# Patient Record
Sex: Male | Born: 1960 | Race: White | Hispanic: No | Marital: Single | Smoking: Current every day smoker
Health system: Southern US, Community
[De-identification: ages and names within clinical notes are randomized; demographics above are authoritative.]

## PROBLEM LIST (undated history)

## (undated) DIAGNOSIS — F4310 Post-traumatic stress disorder, unspecified: Secondary | ICD-10-CM

## (undated) DIAGNOSIS — B1920 Unspecified viral hepatitis C without hepatic coma: Secondary | ICD-10-CM

## (undated) DIAGNOSIS — G8929 Other chronic pain: Secondary | ICD-10-CM

## (undated) DIAGNOSIS — M549 Dorsalgia, unspecified: Secondary | ICD-10-CM

## (undated) HISTORY — PX: TONSILLECTOMY: SUR1361

---

## 1998-03-30 ENCOUNTER — Emergency Department (HOSPITAL_COMMUNITY): Admission: EM | Admit: 1998-03-30 | Discharge: 1998-03-30 | Payer: Self-pay | Admitting: Emergency Medicine

## 2010-08-17 ENCOUNTER — Emergency Department (HOSPITAL_COMMUNITY)
Admission: EM | Admit: 2010-08-17 | Discharge: 2010-08-17 | Payer: Self-pay | Source: Home / Self Care | Admitting: Emergency Medicine

## 2010-08-20 ENCOUNTER — Emergency Department (HOSPITAL_COMMUNITY)
Admission: EM | Admit: 2010-08-20 | Discharge: 2010-08-20 | Payer: Self-pay | Source: Home / Self Care | Admitting: Emergency Medicine

## 2010-10-24 ENCOUNTER — Emergency Department (HOSPITAL_COMMUNITY)
Admission: EM | Admit: 2010-10-24 | Discharge: 2010-10-24 | Disposition: A | Discharge status: Home / Self Care | Payer: Self-pay | Attending: Emergency Medicine | Admitting: Emergency Medicine

## 2010-10-24 DIAGNOSIS — K029 Dental caries, unspecified: Secondary | ICD-10-CM | POA: Insufficient documentation

## 2010-10-24 DIAGNOSIS — K089 Disorder of teeth and supporting structures, unspecified: Secondary | ICD-10-CM | POA: Insufficient documentation

## 2017-01-09 ENCOUNTER — Other Ambulatory Visit: Payer: Self-pay | Admitting: Gastroenterology

## 2017-01-09 DIAGNOSIS — R109 Unspecified abdominal pain: Secondary | ICD-10-CM

## 2017-01-10 ENCOUNTER — Ambulatory Visit
Admission: RE | Admit: 2017-01-10 | Discharge: 2017-01-10 | Disposition: A | Discharge status: Home / Self Care | Payer: Medicare Other | Source: Ambulatory Visit | Attending: Gastroenterology | Admitting: Gastroenterology

## 2017-01-10 DIAGNOSIS — R109 Unspecified abdominal pain: Secondary | ICD-10-CM

## 2017-01-10 IMAGING — CT CT ABD-PELV W/ CM
3 of 5 series · 12 of 36 positions shown, 18 images · IV contrast (READICAT/WATER & [ID] ISOVUE 300)
Comparison: None.

CLINICAL DATA: General abd pain x 3 months. Nausea and vomiting.
Occ fever, diarrhea and constipation. No prev surg or hx ca. No prev
CT.^100mL [id number]-[id number] IOPAMIDOL (A9R9XF-LZZ) INJECTION 61%

EXAM:
CT ABDOMEN AND PELVIS WITH CONTRAST
TECHNIQUE: Multidetector CT imaging of the abdomen and pelvis was performed
using the standard protocol following bolus administration of
intravenous contrast.
CONTRAST:  100mL [id number]-[id number] IOPAMIDOL (A9R9XF-LZZ) INJECTION 61%

[Series 3: abd/pelvis with · axial · 0.74mm/px · z∈[-336,+14]mm · 8 of 92 slices shown, 13 images]
[im 11/92  soft-tissue]
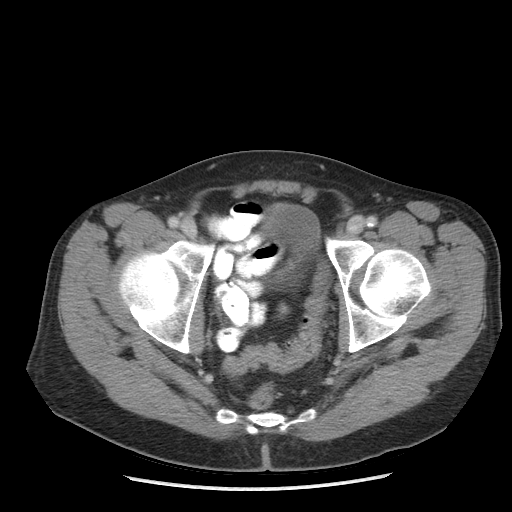
[im 11/92  bone]
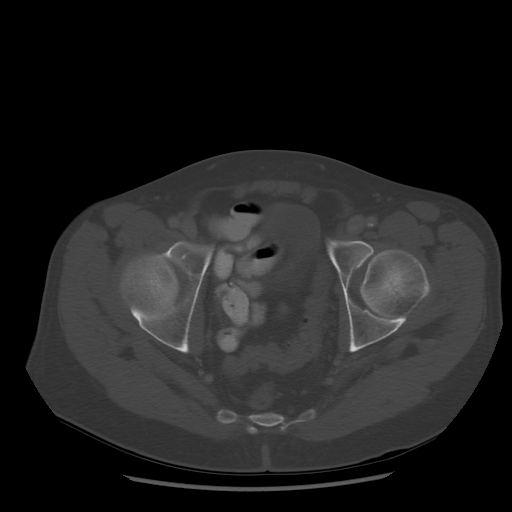
[im 21/92  soft-tissue]
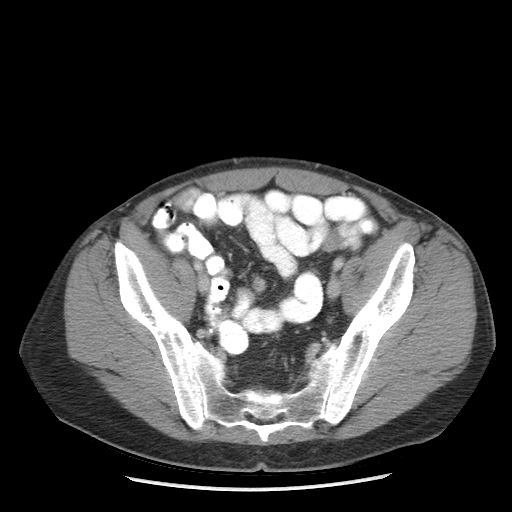
[im 31/92  soft-tissue]
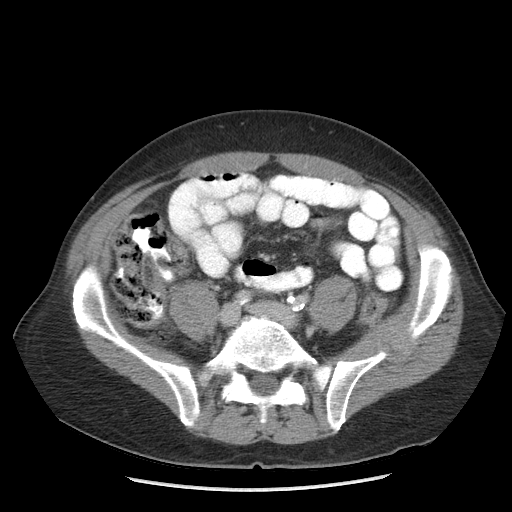
[im 41/92  soft-tissue]
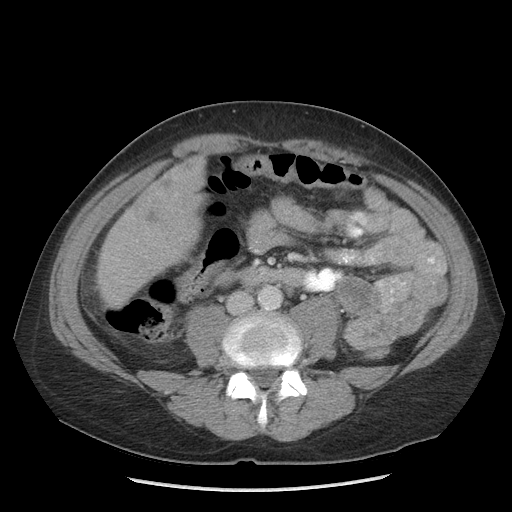
[im 51/92  soft-tissue]
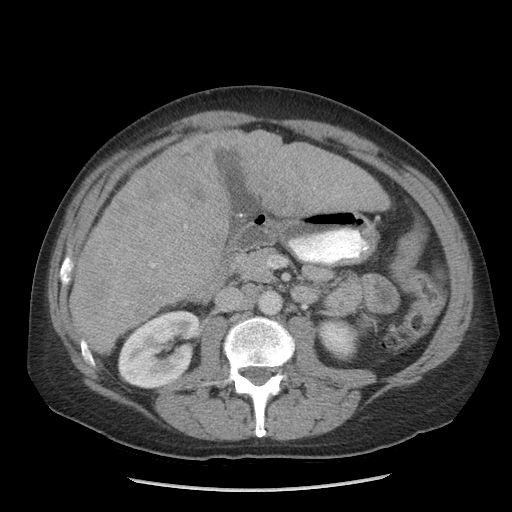
[im 51/92  lung]
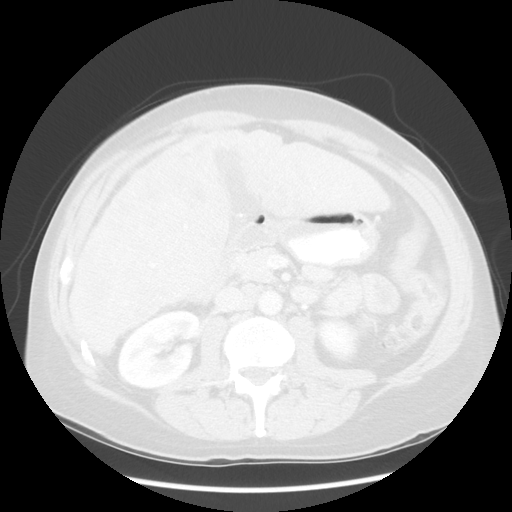
[im 61/92  soft-tissue]
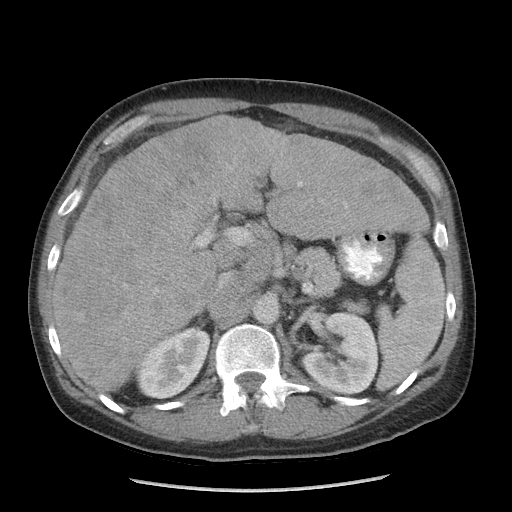
[im 61/92  lung]
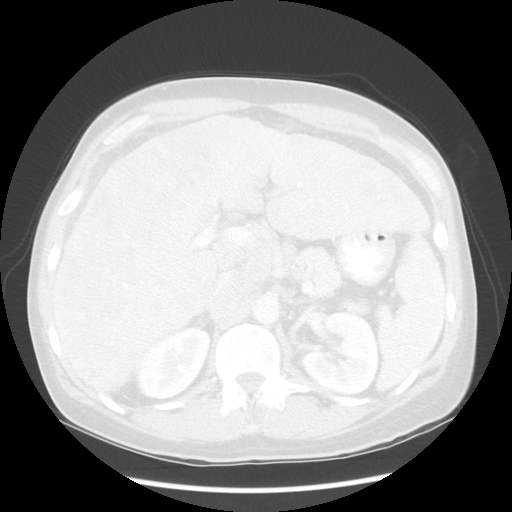
[im 71/92  soft-tissue]
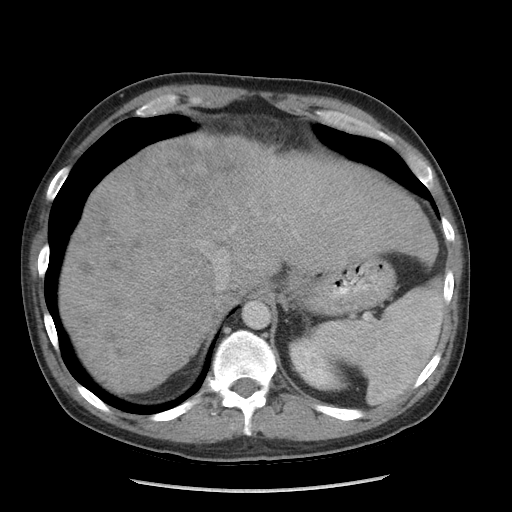
[im 71/92  lung]
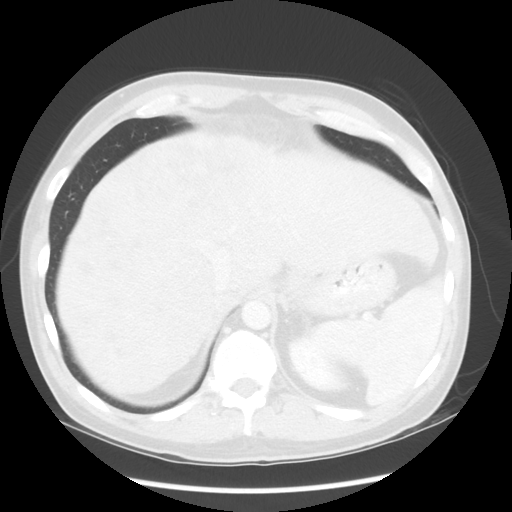
[im 81/92  soft-tissue]
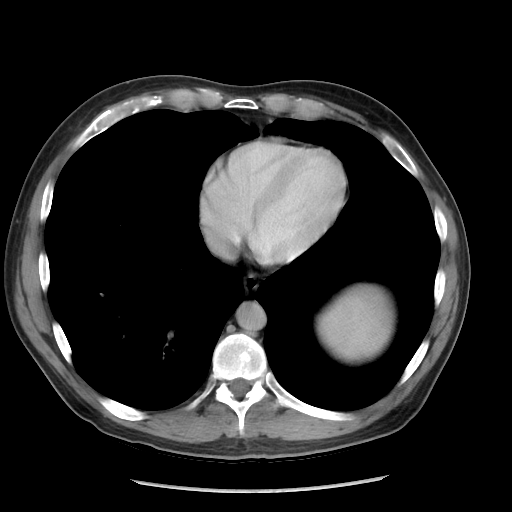
[im 81/92  lung]
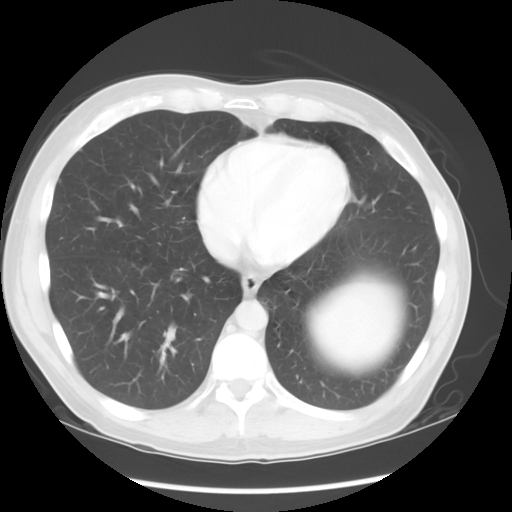

[Series 601: coronal body · coronal · 0.99mm/px · 1 of 121 slices shown, 2 images]
[im 41/121  soft-tissue]
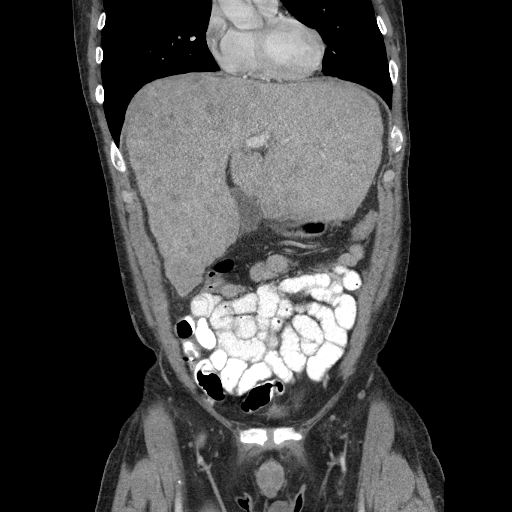
[im 41/121  bone]
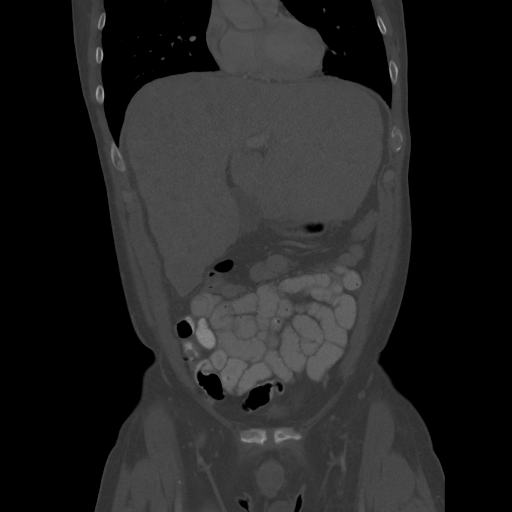

[Series 602: sagittal body · sagittal · 0.99mm/px · 3 of 153 slices shown]
[im 11/153  soft-tissue]
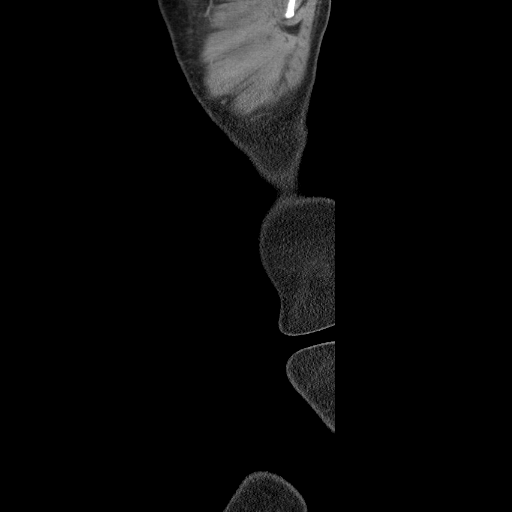
[im 31/153  soft-tissue]
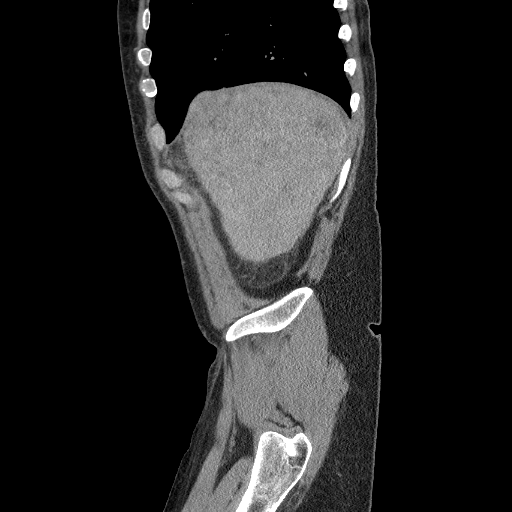
[im 51/153  soft-tissue]
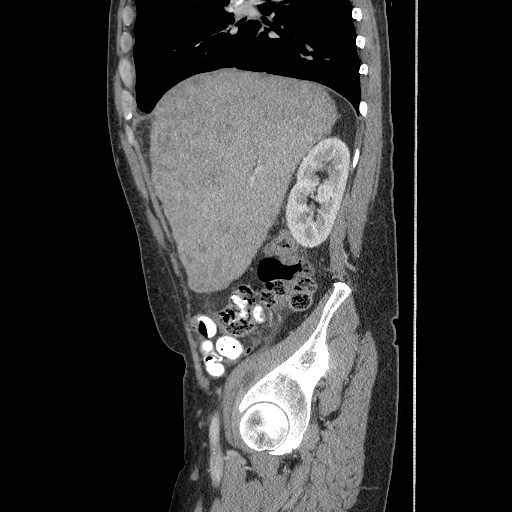

[12 of 36 positions shown; findings below may reference images not displayed]

FINDINGS: Lower chest: Centrilobular emphysema. Right middle lobe 2 mm nodule
with on image 11/series 4. Normal heart size without pericardial or
pleural effusion.

Hepatobiliary: Hepatomegaly at 20.9 cm. Diffuse heterogeneous
hepatic density, most consistent with diffuse metastasis or
multifocal hepatocellular carcinoma. Example high right hepatic lobe
hypoattenuating lesion at 1.9 cm on image 25/series 3. Lateral
segment left liver lobe hypoattenuating lesion at 2.9 cm on image
35/series 3. Probable small gallston, without acute cholecystitis or
biliary duct dilatation.

Pancreas: Normal, without mass or ductal dilatation.

Spleen: Mild splenomegaly 14.9 cm craniocaudal.

Adrenals/Urinary Tract: Normal adrenal glands. Normal kidneys,
without hydronephrosis. Normal urinary bladder.

Stomach/Bowel: Normal stomach, without wall thickening. Scattered
colonic diverticula. Normal terminal ileum and appendix. Normal
small bowel.

Vascular/Lymphatic: Aortic and branch vessel atherosclerosis.
Abdominal adenopathy, including an aortocaval node which measures 11
mm on image 41/series 3. Porta hepatis node measures 2.3 x 4.3 cm on
image 36/series 3. No pelvic sidewall adenopathy.

Reproductive: Normal prostate.

Other: No significant free fluid. No evidence of omental or
peritoneal disease.

Musculoskeletal: Osseous metastasis, including a lytic lesion within
the posterior right acetabulum which measures 3.1 x 2.9 cm on image
87/series 3. No extension to the joint surface.

left iliac lytic lesion on image 63/series 3. Degenerative disc
disease at the lumbosacral junction.
IMPRESSION: 1. Hepatomegaly with diffuse hepatic metastasis versus multifocal
hepatocellular carcinoma. Irregular hepatic capsule is suspicious
for cirrhosis. Alternatively, capsular irregularity could be
secondary to diffuse metastasis.
2. Bone metastasis.
3. Abdominal adenopathy, most consistent with nodal metastasis.
4. Consider PET to direct tissue sampling versus liver biopsy.
5.  Aortic atherosclerosis.
6. Nonspecific right lung base nodule.
These results will be called to the ordering clinician or
representative by the Radiologist Assistant, and communication
documented in the PACS or zVision Dashboard.

## 2017-01-10 MED ORDER — IOPAMIDOL (ISOVUE-300) INJECTION 61%
100.0000 mL | Freq: Once | INTRAVENOUS | Status: AC | PRN
  Administered 2017-01-10: 100 mL via INTRAVENOUS

## 2017-01-11 ENCOUNTER — Emergency Department (HOSPITAL_COMMUNITY)
Admission: EM | Admit: 2017-01-11 | Discharge: 2017-01-11 | Disposition: A | Discharge status: Home / Self Care | Payer: Medicare Other | Attending: Emergency Medicine | Admitting: Emergency Medicine

## 2017-01-11 ENCOUNTER — Encounter (HOSPITAL_COMMUNITY): Payer: Self-pay | Admitting: Emergency Medicine

## 2017-01-11 DIAGNOSIS — R109 Unspecified abdominal pain: Secondary | ICD-10-CM | POA: Insufficient documentation

## 2017-01-11 DIAGNOSIS — Z5321 Procedure and treatment not carried out due to patient leaving prior to being seen by health care provider: Secondary | ICD-10-CM | POA: Insufficient documentation

## 2017-01-11 HISTORY — DX: Other chronic pain: G89.29

## 2017-01-11 HISTORY — DX: Post-traumatic stress disorder, unspecified: F43.10

## 2017-01-11 HISTORY — DX: Unspecified viral hepatitis C without hepatic coma: B19.20

## 2017-01-11 HISTORY — DX: Dorsalgia, unspecified: M54.9

## 2017-01-11 NOTE — ED Notes (Signed)
Pt states he was not waiting to see the doctor as his doctor told him he was going to have a bed in the hospital  Pt was informed he had a bed in the ED and the ED Dr would see him and if he needed to be admitted we would do that  Pt states he is not waiting to see the doctor and he would call his own dr when he got home

## 2017-01-11 NOTE — ED Triage Notes (Signed)
Pt states he was sent here by Dr Paulita Fujita for a liver biopsy to test for liver cancer  Pt states he was told he would have a room but registration states they dont know anything about it  Pt states he has sharp stabbing abd pain

## 2017-01-12 ENCOUNTER — Emergency Department (HOSPITAL_COMMUNITY)
Admission: EM | Admit: 2017-01-12 | Discharge: 2017-01-12 | Disposition: A | Discharge status: Home / Self Care | Payer: Medicare Other | Attending: Emergency Medicine | Admitting: Emergency Medicine

## 2017-01-12 ENCOUNTER — Encounter (HOSPITAL_COMMUNITY): Payer: Self-pay | Admitting: Emergency Medicine

## 2017-01-12 DIAGNOSIS — R109 Unspecified abdominal pain: Secondary | ICD-10-CM

## 2017-01-12 DIAGNOSIS — F17210 Nicotine dependence, cigarettes, uncomplicated: Secondary | ICD-10-CM | POA: Diagnosis not present

## 2017-01-12 DIAGNOSIS — R1011 Right upper quadrant pain: Secondary | ICD-10-CM | POA: Diagnosis present

## 2017-01-12 LAB — CBC
HEMATOCRIT: 40.5 % (ref 39.0–52.0)
HEMOGLOBIN: 13.4 g/dL (ref 13.0–17.0)
MCH: 31.2 pg (ref 26.0–34.0)
MCHC: 33.1 g/dL (ref 30.0–36.0)
MCV: 94.4 fL (ref 78.0–100.0)
Platelets: 235 10*3/uL (ref 150–400)
RBC: 4.29 MIL/uL (ref 4.22–5.81)
RDW: 16 % — ABNORMAL HIGH (ref 11.5–15.5)
WBC: 6.8 10*3/uL (ref 4.0–10.5)

## 2017-01-12 LAB — COMPREHENSIVE METABOLIC PANEL
ALBUMIN: 3.2 g/dL — AB (ref 3.5–5.0)
ALT: 88 U/L — ABNORMAL HIGH (ref 17–63)
ANION GAP: 7 (ref 5–15)
AST: 118 U/L — AB (ref 15–41)
Alkaline Phosphatase: 198 U/L — ABNORMAL HIGH (ref 38–126)
BUN: 8 mg/dL (ref 6–20)
CHLORIDE: 101 mmol/L (ref 101–111)
CO2: 24 mmol/L (ref 22–32)
Calcium: 11.5 mg/dL — ABNORMAL HIGH (ref 8.9–10.3)
Creatinine, Ser: 0.85 mg/dL (ref 0.61–1.24)
GFR calc Af Amer: >60 mL/min (ref >60)
GFR calc non Af Amer: >60 mL/min (ref >60)
GLUCOSE: 111 mg/dL — AB (ref 65–99)
POTASSIUM: 4.1 mmol/L (ref 3.5–5.1)
SODIUM: 132 mmol/L — AB (ref 135–145)
Total Bilirubin: 0.6 mg/dL (ref 0.3–1.2)
Total Protein: 7.9 g/dL (ref 6.5–8.1)

## 2017-01-12 LAB — URINALYSIS, ROUTINE W REFLEX MICROSCOPIC
Bilirubin Urine: NEGATIVE
GLUCOSE, UA: NEGATIVE mg/dL
HGB URINE DIPSTICK: NEGATIVE
Ketones, ur: NEGATIVE mg/dL
Leukocytes, UA: NEGATIVE
NITRITE: NEGATIVE
PH: 5 (ref 5.0–8.0)
Protein, ur: NEGATIVE mg/dL
SPECIFIC GRAVITY, URINE: 1.023 (ref 1.005–1.030)

## 2017-01-12 LAB — LIPASE, BLOOD: Lipase: 23 U/L (ref 11–51)

## 2017-01-12 MED ORDER — SODIUM CHLORIDE 0.9 % IV SOLN: INTRAVENOUS | Status: DC

## 2017-01-12 MED ORDER — ONDANSETRON HCL 4 MG/2ML IJ SOLN
4.0000 mg | Freq: Once | INTRAMUSCULAR | Status: DC
  Filled 2017-01-12: qty 2

## 2017-01-12 MED ORDER — SODIUM CHLORIDE 0.9 % IV BOLUS (SEPSIS)
1000.0000 mL | Freq: Once | INTRAVENOUS | Status: AC
  Administered 2017-01-12: 1000 mL via INTRAVENOUS

## 2017-01-12 MED ORDER — KETOROLAC TROMETHAMINE 30 MG/ML IJ SOLN
15.0000 mg | Freq: Once | INTRAMUSCULAR | Status: DC
  Filled 2017-01-12: qty 1

## 2017-01-12 MED ORDER — HYDROMORPHONE HCL 1 MG/ML IJ SOLN
1.0000 mg | Freq: Once | INTRAMUSCULAR | Status: DC
  Filled 2017-01-12: qty 1

## 2017-01-12 NOTE — ED Triage Notes (Signed)
Patient reports that he was sent here by Dr Paulita Fujita for complaints of liver pain. Reports abdominal pain on right side. Nausea/vomiting. Patient in triage drinking sweet tea.

## 2017-01-12 NOTE — ED Notes (Signed)
Pt called for v/s recheck, no response from lobby 

## 2017-01-12 NOTE — ED Notes (Signed)
Pt called for v/s recheck, pt not in lobby

## 2017-01-12 NOTE — ED Notes (Signed)
Pt walking out to car to get reading glasses at this time. Will get v/s when pt returns

## 2017-01-12 NOTE — ED Notes (Signed)
Patient refuse blood draw

## 2017-01-12 NOTE — ED Notes (Signed)
Patient requested to not be straight stuck for blood work, requests for the IV so he does not have to be stuck multiple times. RN aware.

## 2017-01-12 NOTE — ED Notes (Signed)
Patient removed iv himself, states it was sucking air.  RN explained to patient that it was saline locked and it wasn't possible for it to suck in air. Pt refused toradol, stated it gave him a bad headache. Pt also refused additional fluids and stated he was just ready to get out of here.

## 2017-01-12 NOTE — ED Provider Notes (Signed)
[facility] DEPT Provider Note   CSN: [id number] Arrival date & time: 01/12/17  1534     History   Chief Complaint Chief Complaint  Patient presents with  . Abdominal Pain    HPI Dennis Bell is a 56 y.o. male.  56 year old male with history of hepatitis C and cirrhosis presents with ongoing chronic right upper quadrant abdominal discomfort due to his hepatitis. Denies any recent fever, chills, vomiting. No black or bloody stools. Saw his gastroenterologist and has been instructed that he needs to have a liver biopsies. States his pain is been persistent and characterized as sharp. Worse with certain movements and a better with nothing. No medication use prior to arrival. Denies any current use of alcohol or illicit drugs      Past Medical History:  Diagnosis Date  . Chronic back pain   . Hepatitis C   . PTSD (post-traumatic stress disorder)     There are no active problems to display for this patient.   Past Surgical History:  Procedure Laterality Date  . TONSILLECTOMY         Home Medications    Prior to Admission medications   Not on File    Family History Family History  Problem Relation Age of Onset  . Hypertension Mother     Social History Social History  Substance Use Topics  . Smoking status: Current Every Day Smoker    Types: Cigarettes  . Smokeless tobacco: Never Used  . Alcohol use No     Allergies   Codeine and Penicillins   Review of Systems Review of Systems  All other systems reviewed and are negative.    Physical Exam Updated Vital Signs BP (!) 141/97 (BP Location: Right Arm)   Pulse (!) 103   Temp 98.4 F (36.9 C) (Oral)   Resp 20   SpO2 98%   Physical Exam  Constitutional: He is oriented to person, place, and time. He appears well-developed and well-nourished.  Non-toxic appearance. No distress.  HENT:  Head: Normocephalic and atraumatic.  Eyes: Conjunctivae, EOM and lids are normal. Pupils are equal,  round, and reactive to light.  Neck: Normal range of motion. Neck supple. No tracheal deviation present. No thyroid mass present.  Cardiovascular: Normal rate, regular rhythm and normal heart sounds.  Exam reveals no gallop.   No murmur heard. Pulmonary/Chest: Effort normal and breath sounds normal. No stridor. No respiratory distress. He has no decreased breath sounds. He has no wheezes. He has no rhonchi. He has no rales.  Abdominal: Soft. Normal appearance and bowel sounds are normal. He exhibits no distension. There is tenderness in the right upper quadrant. There is no rebound and no CVA tenderness.    Musculoskeletal: Normal range of motion. He exhibits no edema or tenderness.  Neurological: He is alert and oriented to person, place, and time. He has normal strength. No cranial nerve deficit or sensory deficit. GCS eye subscore is 4. GCS verbal subscore is 5. GCS motor subscore is 6.  Skin: Skin is warm and dry. No abrasion and no rash noted.  Psychiatric: He has a normal mood and affect. His speech is normal and behavior is normal.  Nursing note and vitals reviewed.    ED Treatments / Results  Labs (all labs ordered are listed, but only abnormal results are displayed) Labs Reviewed  LIPASE, BLOOD  COMPREHENSIVE METABOLIC PANEL  CBC  URINALYSIS, ROUTINE W REFLEX MICROSCOPIC    EKG  EKG Interpretation None  Radiology No results found.  Procedures Procedures (including critical care time)  Medications Ordered in ED Medications  sodium chloride 0.9 % bolus 1,000 mL (not administered)  0.9 %  sodium chloride infusion (not administered)  HYDROmorphone (DILAUDID) injection 1 mg (not administered)  ondansetron (ZOFRAN) injection 4 mg (not administered)     Initial Impression / Assessment and Plan / ED Course  I have reviewed the triage vital signs and the nursing notes.  Pertinent labs & imaging results that were available during my care of the patient were  reviewed by me and considered in my medical decision making (see chart for details).     Patient given pain medication here, Toradol, and feels better. Labs does show mild elevation of transaminases.. Instructed to follow-up with his gastroenterologist  Final Clinical Impressions(s) / ED Diagnoses   Final diagnoses:  None    New Prescriptions New Prescriptions   No medications on file     Lacretia Leigh, MD 01/12/17 2318

## 2017-01-16 ENCOUNTER — Other Ambulatory Visit: Payer: Self-pay | Admitting: Gastroenterology

## 2017-01-16 DIAGNOSIS — K7469 Other cirrhosis of liver: Secondary | ICD-10-CM

## 2017-01-19 ENCOUNTER — Other Ambulatory Visit: Payer: Self-pay | Admitting: Gastroenterology

## 2017-01-19 DIAGNOSIS — R935 Abnormal findings on diagnostic imaging of other abdominal regions, including retroperitoneum: Secondary | ICD-10-CM

## 2017-01-19 DIAGNOSIS — R109 Unspecified abdominal pain: Secondary | ICD-10-CM

## 2017-01-19 DIAGNOSIS — K7460 Unspecified cirrhosis of liver: Secondary | ICD-10-CM

## 2017-01-24 ENCOUNTER — Ambulatory Visit
Admission: RE | Admit: 2017-01-24 | Discharge: 2017-01-24 | Disposition: A | Discharge status: Home / Self Care | Payer: Medicare Other | Source: Ambulatory Visit | Attending: Gastroenterology | Admitting: Gastroenterology

## 2017-01-24 DIAGNOSIS — R935 Abnormal findings on diagnostic imaging of other abdominal regions, including retroperitoneum: Secondary | ICD-10-CM

## 2017-01-24 DIAGNOSIS — R109 Unspecified abdominal pain: Secondary | ICD-10-CM

## 2017-01-24 DIAGNOSIS — K7460 Unspecified cirrhosis of liver: Secondary | ICD-10-CM

## 2017-01-24 IMAGING — CT CT ABDOMEN WO/W CM
4 of 11 series · 14 of 36 positions shown, 18 images · IV contrast (iopamidol)
Comparison: 01/10/2017

CLINICAL DATA: Cirrhosis.  Followup liver metastases.

EXAM:
CT ABDOMEN WITHOUT AND WITH CONTRAST
TECHNIQUE: Multidetector CT imaging of the abdomen was performed following the
standard protocol before and following the bolus administration of
intravenous contrast.
CONTRAST:  100mL DNF7HS-NDD IOPAMIDOL (DNF7HS-NDD) INJECTION 61%

[Series 6: arterial (id) · axial · arterial · 0.70mm/px · z∈[-180,-55]mm · 3 of 102 slices shown]
[im 26/102  soft-tissue]
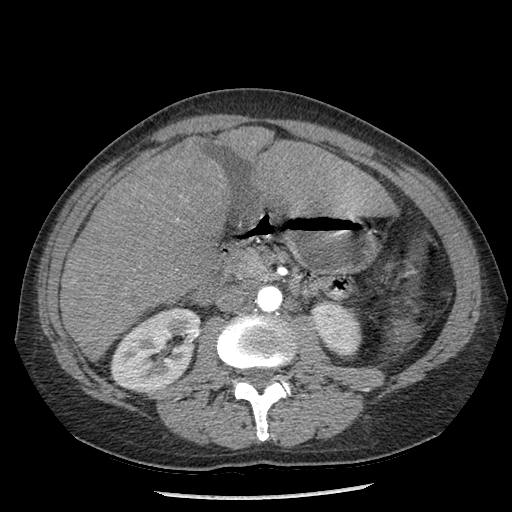
[im 51/102  soft-tissue]
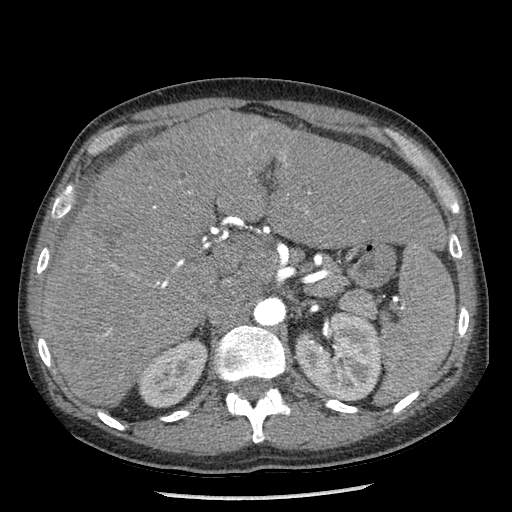
[im 76/102  soft-tissue]
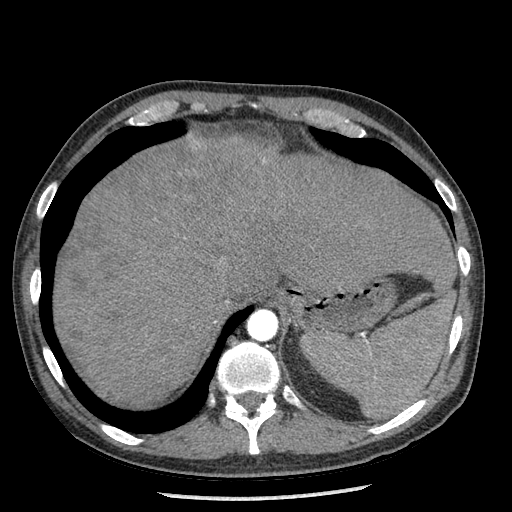

[Series 9: portal venous (id) · axial · portal-venous · 0.71mm/px · z∈[-194,-58]mm · 3 of 110 slices shown, 7 images]
[im 28/110  soft-tissue]
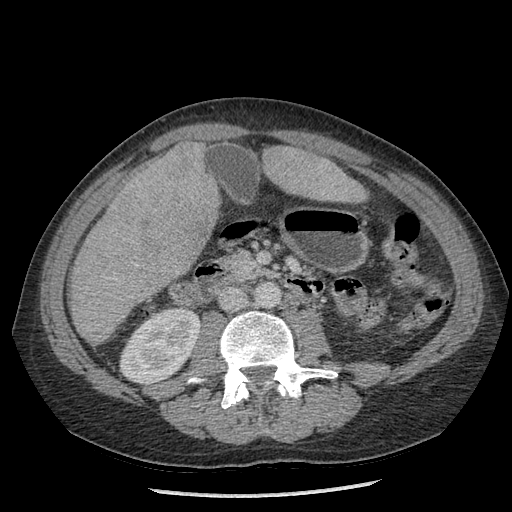
[im 28/110  lung]
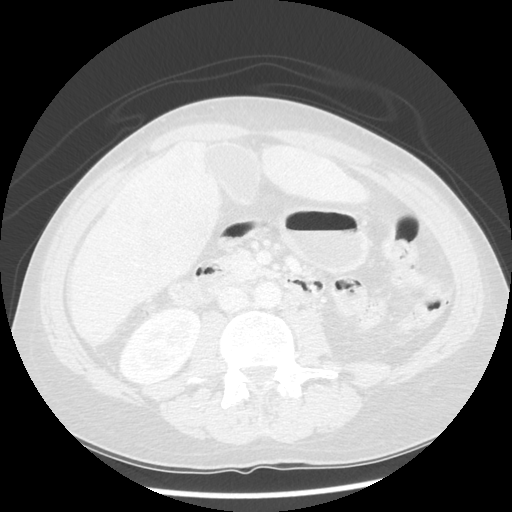
[im 28/110  bone]
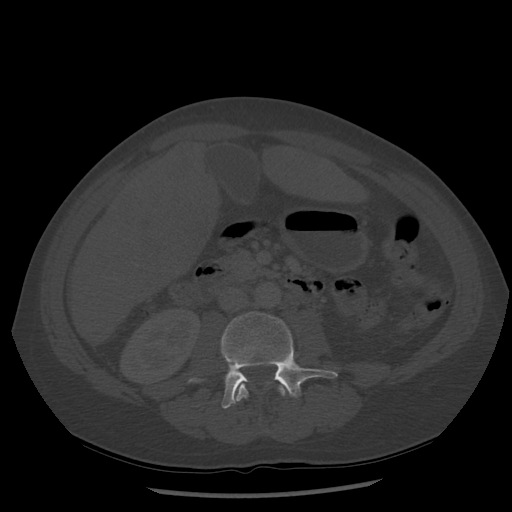
[im 55/110  soft-tissue]
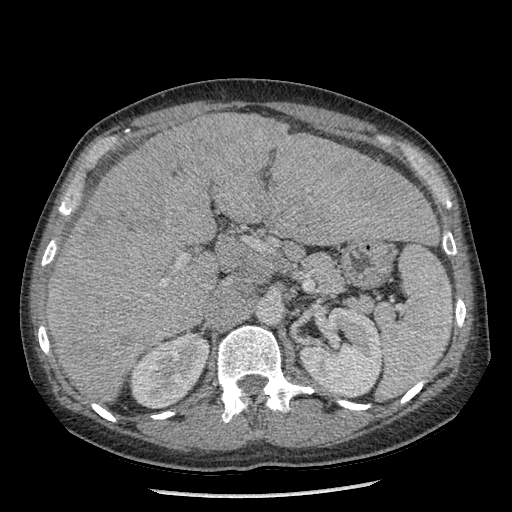
[im 55/110  lung]
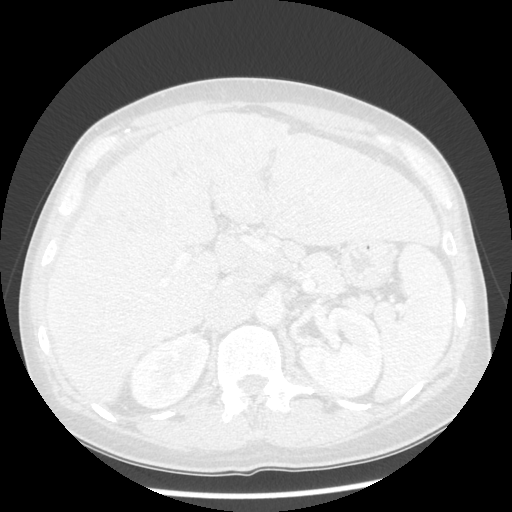
[im 82/110  soft-tissue]
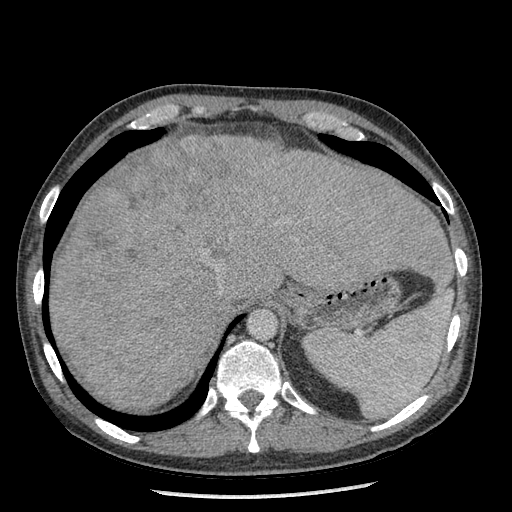
[im 82/110  lung]
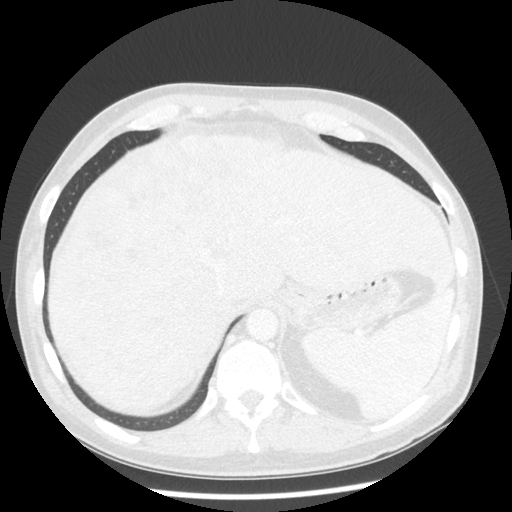

[Series 602: sagittal body · sagittal · 0.70mm/px · 3 of 143 slices shown (1 of 2)]
[im 24/143  soft-tissue]
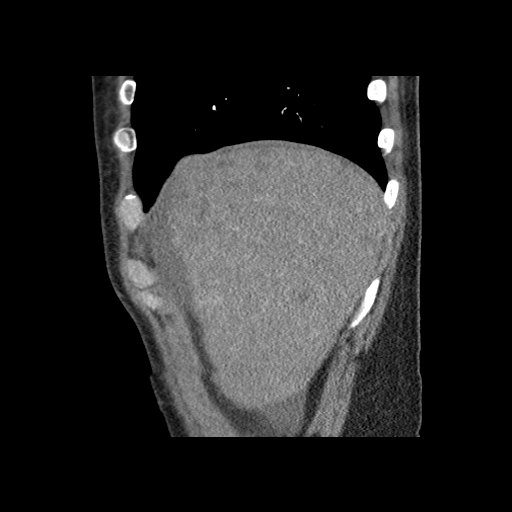
[im 48/143  soft-tissue]
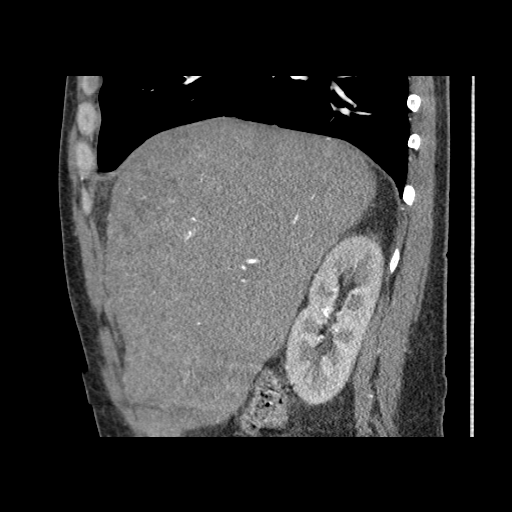
[im 72/143  soft-tissue]
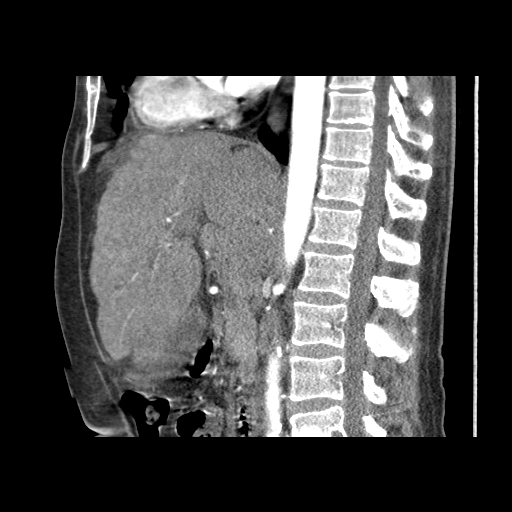

[Series 605: sagittal body · sagittal · 0.71mm/px · 5 of 143 slices shown (2 of 2)]
[im 24/143  soft-tissue]
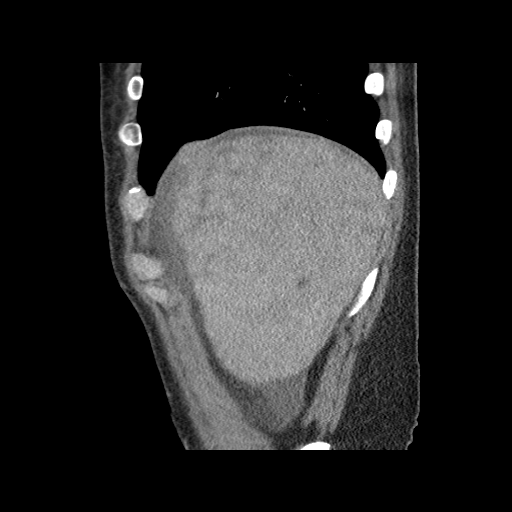
[im 48/143  soft-tissue]
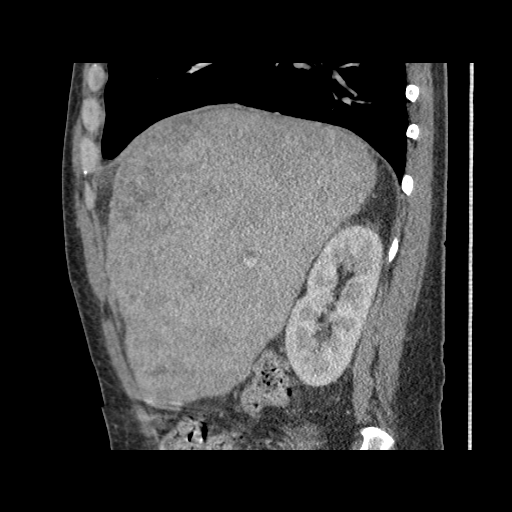
[im 72/143  soft-tissue]
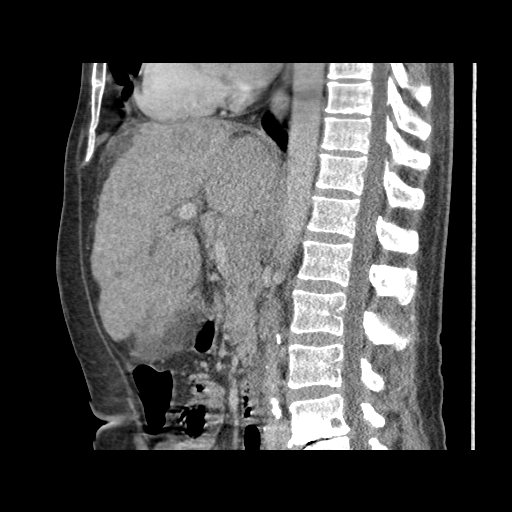
[im 95/143  soft-tissue]
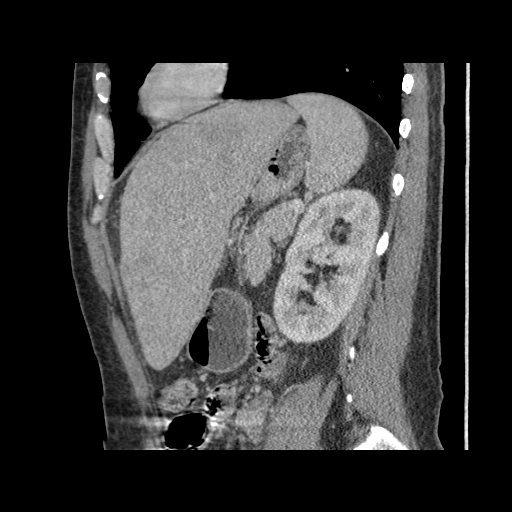
[im 119/143  soft-tissue]
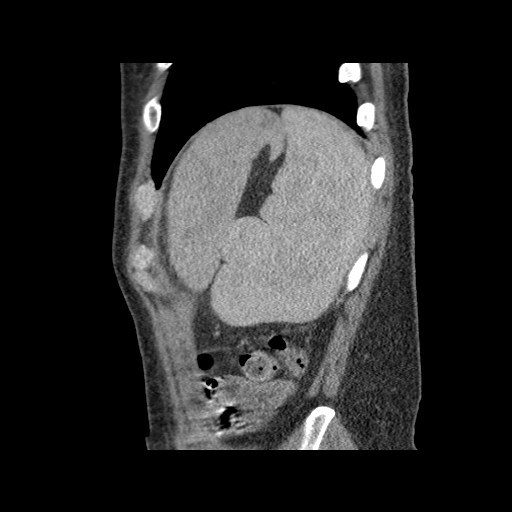

[14 of 36 positions shown; findings below may reference images not displayed]

FINDINGS: Lower chest: 4 mm right middle lobe subpleural nodule is unchanged.

Hepatobiliary: There are innumerable low-attenuation lesions
throughout the liver compatible with metastatic disease. Index
lesion within the lateral segment of the left lobe measures 3.3 cm,
image 58 of series 9. Previously 2.9 cm. Index lesion within the
posterior right lobe of liver measures 2.9 cm, image 37 of series 9.
Previously 1.9 cm. The liver has a cirrhotic appearance with a
diffuse nodular contour or. Hypertrophy of the lateral segment and
caudate lobe of liver noted. There is narrowing of the proximal
portion of the right portal vein, image 48 of series 9. Findings may
reflect mass effect from adjacent tumor, partial thrombosis or tumor
involvement of the portal vein. This appears progressive when
compared with earlier exam.

Pancreas: Unremarkable. No pancreatic ductal dilatation or
surrounding inflammatory changes.

Spleen: The spleen measures 16.4 cm in length.

Adrenals/Urinary Tract: The adrenal glands are normal. The kidneys
are unremarkable. No mass or hydronephrosis.

Stomach/Bowel: Stomach is within normal limits. No evidence of bowel
wall thickening, distention, or inflammatory changes.

Vascular/Lymphatic: Aortic atherosclerosis. The index porta hepatic
lymph node measures 2.2 x 4.2 cm, image 59 of series 9. Unchanged
from previous exam. The index index aortocaval node is stable
measuring 1.2 cm. Periaortic node measures 1 cm, image number 80 of
series 9. Unchanged from previous exam. Index gastrohepatic ligament
node Measures 1.5 cm, image 34 of series 9. Unchanged from previous
exam.

Other: Small volume of ascites is identified within the upper
abdomen.

Musculoskeletal: No acute or significant osseous findings.
IMPRESSION: 1. There has been mild progression of multifocal liver metastases.
2. The morphology of the liver is compatible with cirrhosis with
hypertrophy of the caudate lobe and lateral segment and nodularity
of the contour.
3. Progressive narrowing of the proximal aspect of the right portal
vein which may reflect partial thrombosis versus external mass
effect from liver tumor.
4. Similar appearance of upper abdominal adenopathy
5. Small volume of ascites.
These results will be called to the ordering clinician or
representative by the Radiologist Assistant, and communication
documented in the PACS or zVision Dashboard.

## 2017-01-24 MED ORDER — IOPAMIDOL (ISOVUE-300) INJECTION 61%
100.0000 mL | Freq: Once | INTRAVENOUS | Status: AC | PRN
  Administered 2017-01-24: 100 mL via INTRAVENOUS

## 2017-01-25 ENCOUNTER — Other Ambulatory Visit (HOSPITAL_COMMUNITY): Payer: Self-pay | Admitting: Gastroenterology

## 2017-01-25 DIAGNOSIS — R160 Hepatomegaly, not elsewhere classified: Secondary | ICD-10-CM

## 2017-02-24 ENCOUNTER — Inpatient Hospital Stay (HOSPITAL_COMMUNITY)
Admission: EM | Admit: 2017-02-24 | Discharge: 2017-02-28 | DRG: 640 | Disposition: A | Discharge status: Hospice | Payer: Medicare Other | Attending: Internal Medicine | Admitting: Internal Medicine

## 2017-02-24 ENCOUNTER — Encounter (HOSPITAL_COMMUNITY): Payer: Self-pay

## 2017-02-24 DIAGNOSIS — B182 Chronic viral hepatitis C: Secondary | ICD-10-CM | POA: Diagnosis present

## 2017-02-24 DIAGNOSIS — Z9119 Patient's noncompliance with other medical treatment and regimen: Secondary | ICD-10-CM | POA: Diagnosis not present

## 2017-02-24 DIAGNOSIS — R1011 Right upper quadrant pain: Secondary | ICD-10-CM | POA: Diagnosis not present

## 2017-02-24 DIAGNOSIS — N179 Acute kidney failure, unspecified: Secondary | ICD-10-CM | POA: Diagnosis present

## 2017-02-24 DIAGNOSIS — Z880 Allergy status to penicillin: Secondary | ICD-10-CM | POA: Diagnosis not present

## 2017-02-24 DIAGNOSIS — Z590 Homelessness: Secondary | ICD-10-CM | POA: Diagnosis not present

## 2017-02-24 DIAGNOSIS — Z6825 Body mass index (BMI) 25.0-25.9, adult: Secondary | ICD-10-CM | POA: Diagnosis not present

## 2017-02-24 DIAGNOSIS — Z885 Allergy status to narcotic agent status: Secondary | ICD-10-CM

## 2017-02-24 DIAGNOSIS — C220 Liver cell carcinoma: Secondary | ICD-10-CM

## 2017-02-24 DIAGNOSIS — R64 Cachexia: Secondary | ICD-10-CM | POA: Diagnosis present

## 2017-02-24 DIAGNOSIS — R772 Abnormality of alphafetoprotein: Secondary | ICD-10-CM | POA: Diagnosis present

## 2017-02-24 DIAGNOSIS — E875 Hyperkalemia: Secondary | ICD-10-CM | POA: Diagnosis present

## 2017-02-24 DIAGNOSIS — Z515 Encounter for palliative care: Secondary | ICD-10-CM | POA: Diagnosis not present

## 2017-02-24 DIAGNOSIS — K769 Liver disease, unspecified: Secondary | ICD-10-CM

## 2017-02-24 DIAGNOSIS — E871 Hypo-osmolality and hyponatremia: Secondary | ICD-10-CM | POA: Diagnosis present

## 2017-02-24 DIAGNOSIS — Z66 Do not resuscitate: Secondary | ICD-10-CM | POA: Diagnosis not present

## 2017-02-24 DIAGNOSIS — F1290 Cannabis use, unspecified, uncomplicated: Secondary | ICD-10-CM | POA: Diagnosis not present

## 2017-02-24 DIAGNOSIS — K7460 Unspecified cirrhosis of liver: Secondary | ICD-10-CM | POA: Diagnosis present

## 2017-02-24 DIAGNOSIS — R109 Unspecified abdominal pain: Secondary | ICD-10-CM | POA: Diagnosis present

## 2017-02-24 DIAGNOSIS — C772 Secondary and unspecified malignant neoplasm of intra-abdominal lymph nodes: Secondary | ICD-10-CM | POA: Diagnosis present

## 2017-02-24 DIAGNOSIS — F4310 Post-traumatic stress disorder, unspecified: Secondary | ICD-10-CM | POA: Diagnosis present

## 2017-02-24 DIAGNOSIS — G9341 Metabolic encephalopathy: Secondary | ICD-10-CM | POA: Diagnosis present

## 2017-02-24 DIAGNOSIS — R198 Other specified symptoms and signs involving the digestive system and abdomen: Secondary | ICD-10-CM

## 2017-02-24 DIAGNOSIS — D649 Anemia, unspecified: Secondary | ICD-10-CM | POA: Diagnosis present

## 2017-02-24 DIAGNOSIS — R627 Adult failure to thrive: Secondary | ICD-10-CM | POA: Diagnosis present

## 2017-02-24 DIAGNOSIS — R0600 Dyspnea, unspecified: Secondary | ICD-10-CM | POA: Diagnosis not present

## 2017-02-24 DIAGNOSIS — R1031 Right lower quadrant pain: Secondary | ICD-10-CM | POA: Diagnosis not present

## 2017-02-24 DIAGNOSIS — C7951 Secondary malignant neoplasm of bone: Secondary | ICD-10-CM | POA: Diagnosis not present

## 2017-02-24 DIAGNOSIS — E43 Unspecified severe protein-calorie malnutrition: Secondary | ICD-10-CM | POA: Diagnosis present

## 2017-02-24 DIAGNOSIS — E860 Dehydration: Secondary | ICD-10-CM | POA: Diagnosis present

## 2017-02-24 DIAGNOSIS — C799 Secondary malignant neoplasm of unspecified site: Secondary | ICD-10-CM

## 2017-02-24 DIAGNOSIS — R1900 Intra-abdominal and pelvic swelling, mass and lump, unspecified site: Secondary | ICD-10-CM

## 2017-02-24 DIAGNOSIS — F0631 Mood disorder due to known physiological condition with depressive features: Secondary | ICD-10-CM | POA: Diagnosis not present

## 2017-02-24 DIAGNOSIS — F17210 Nicotine dependence, cigarettes, uncomplicated: Secondary | ICD-10-CM | POA: Diagnosis present

## 2017-02-24 DIAGNOSIS — R1084 Generalized abdominal pain: Secondary | ICD-10-CM | POA: Diagnosis not present

## 2017-02-24 LAB — CBC WITH DIFFERENTIAL/PLATELET
BASOS ABS: 0 10*3/uL (ref 0.0–0.1)
Basophils Relative: 0 %
EOS ABS: 0.1 10*3/uL (ref 0.0–0.7)
Eosinophils Relative: 1 %
HCT: 42.8 % (ref 39.0–52.0)
HEMOGLOBIN: 14.2 g/dL (ref 13.0–17.0)
Lymphocytes Relative: 8 %
Lymphs Abs: 0.9 10*3/uL (ref 0.7–4.0)
MCH: 32.3 pg (ref 26.0–34.0)
MCHC: 33.2 g/dL (ref 30.0–36.0)
MCV: 97.5 fL (ref 78.0–100.0)
Monocytes Absolute: 0.5 10*3/uL (ref 0.1–1.0)
Monocytes Relative: 4 %
NEUTROS PCT: 87 %
Neutro Abs: 9.4 10*3/uL — ABNORMAL HIGH (ref 1.7–7.7)
PLATELETS: 246 10*3/uL (ref 150–400)
RBC: 4.39 MIL/uL (ref 4.22–5.81)
RDW: 17.7 % — ABNORMAL HIGH (ref 11.5–15.5)
WBC: 10.9 10*3/uL — AB (ref 4.0–10.5)

## 2017-02-24 LAB — I-STAT CHEM 8, ED
BUN: 28 mg/dL — AB (ref 6–20)
CHLORIDE: 93 mmol/L — AB (ref 101–111)
CREATININE: 1.3 mg/dL — AB (ref 0.61–1.24)
Calcium, Ion: 2.02 mmol/L (ref 1.15–1.40)
Glucose, Bld: 85 mg/dL (ref 65–99)
HCT: 47 % (ref 39.0–52.0)
HEMOGLOBIN: 16 g/dL (ref 13.0–17.0)
POTASSIUM: 4.4 mmol/L (ref 3.5–5.1)
Sodium: 130 mmol/L — ABNORMAL LOW (ref 135–145)
TCO2: 29 mmol/L (ref 0–100)

## 2017-02-24 LAB — COMPREHENSIVE METABOLIC PANEL
ALK PHOS: 166 U/L — AB (ref 38–126)
ALT: 86 U/L — AB (ref 17–63)
AST: 133 U/L — AB (ref 15–41)
Albumin: 3.2 g/dL — ABNORMAL LOW (ref 3.5–5.0)
Anion gap: 10 (ref 5–15)
BUN: 22 mg/dL — AB (ref 6–20)
CO2: 24 mmol/L (ref 22–32)
CREATININE: 1.27 mg/dL — AB (ref 0.61–1.24)
Chloride: 92 mmol/L — ABNORMAL LOW (ref 101–111)
GFR calc Af Amer: >60 mL/min (ref >60)
GFR calc non Af Amer: >60 mL/min (ref >60)
GLUCOSE: 88 mg/dL (ref 65–99)
Potassium: 4.6 mmol/L (ref 3.5–5.1)
Sodium: 126 mmol/L — ABNORMAL LOW (ref 135–145)
Total Bilirubin: 2.7 mg/dL — ABNORMAL HIGH (ref 0.3–1.2)
Total Protein: 7.8 g/dL (ref 6.5–8.1)

## 2017-02-24 LAB — BASIC METABOLIC PANEL
Anion gap: 12 (ref 5–15)
BUN: 22 mg/dL — AB (ref 6–20)
CALCIUM: 13.8 mg/dL — AB (ref 8.9–10.3)
CO2: 23 mmol/L (ref 22–32)
CREATININE: 1.09 mg/dL (ref 0.61–1.24)
Chloride: 95 mmol/L — ABNORMAL LOW (ref 101–111)
GFR calc Af Amer: >60 mL/min (ref >60)
GFR calc non Af Amer: >60 mL/min (ref >60)
GLUCOSE: 88 mg/dL (ref 65–99)
Potassium: 4 mmol/L (ref 3.5–5.1)
Sodium: 130 mmol/L — ABNORMAL LOW (ref 135–145)

## 2017-02-24 LAB — URINALYSIS, ROUTINE W REFLEX MICROSCOPIC
Bilirubin Urine: NEGATIVE
Glucose, UA: NEGATIVE mg/dL
HGB URINE DIPSTICK: NEGATIVE
Ketones, ur: 20 mg/dL — AB
LEUKOCYTES UA: NEGATIVE
Nitrite: NEGATIVE
Protein, ur: NEGATIVE mg/dL
Specific Gravity, Urine: 1.009 (ref 1.005–1.030)
pH: 6 (ref 5.0–8.0)

## 2017-02-24 LAB — TSH: TSH: 10.518 u[IU]/mL — ABNORMAL HIGH (ref 0.350–4.500)

## 2017-02-24 LAB — PROTIME-INR
INR: 1.26
Prothrombin Time: 15.8 seconds — ABNORMAL HIGH (ref 11.4–15.2)

## 2017-02-24 LAB — MAGNESIUM: Magnesium: 1.6 mg/dL — ABNORMAL LOW (ref 1.7–2.4)

## 2017-02-24 LAB — PHOSPHORUS: Phosphorus: 2.3 mg/dL — ABNORMAL LOW (ref 2.5–4.6)

## 2017-02-24 LAB — AMMONIA: Ammonia: 89 umol/L — ABNORMAL HIGH (ref 9–35)

## 2017-02-24 LAB — LIPASE, BLOOD: Lipase: 44 U/L (ref 11–51)

## 2017-02-24 MED ORDER — LORAZEPAM 2 MG/ML IJ SOLN
1.0000 mg | Freq: Once | INTRAMUSCULAR | Status: AC
  Administered 2017-02-24: 1 mg via INTRAVENOUS
  Filled 2017-02-24: qty 1

## 2017-02-24 MED ORDER — SODIUM CHLORIDE 0.9 % IV SOLN
INTRAVENOUS | Status: DC
  Administered 2017-02-24 – 2017-02-26 (×5): via INTRAVENOUS

## 2017-02-24 MED ORDER — HALOPERIDOL LACTATE 5 MG/ML IJ SOLN
2.0000 mg | Freq: Four times a day (QID) | INTRAMUSCULAR | Status: DC | PRN
  Administered 2017-02-25 – 2017-02-28 (×7): 2 mg via INTRAVENOUS
  Filled 2017-02-24 (×7): qty 1

## 2017-02-24 MED ORDER — MORPHINE SULFATE (PF) 4 MG/ML IV SOLN: 4.0000 mg | Freq: Once | INTRAVENOUS | Status: DC

## 2017-02-24 MED ORDER — SODIUM CHLORIDE 0.9 % IV BOLUS (SEPSIS)
2000.0000 mL | Freq: Once | INTRAVENOUS | Status: AC
  Administered 2017-02-24: 2000 mL via INTRAVENOUS

## 2017-02-24 MED ORDER — ONDANSETRON HCL 4 MG/2ML IJ SOLN: 4.0000 mg | Freq: Once | INTRAMUSCULAR | Status: DC

## 2017-02-24 MED ORDER — PROMETHAZINE HCL 25 MG PO TABS: 12.5000 mg | ORAL_TABLET | Freq: Four times a day (QID) | ORAL | Status: DC | PRN

## 2017-02-24 MED ORDER — PNEUMOCOCCAL VAC POLYVALENT 25 MCG/0.5ML IJ INJ
0.5000 mL | INJECTION | INTRAMUSCULAR | Status: DC
  Filled 2017-02-24: qty 0.5

## 2017-02-24 MED ORDER — SODIUM CHLORIDE 0.9 % IV SOLN
4.0000 mg | Freq: Once | INTRAVENOUS | Status: AC
  Administered 2017-02-24: 4 mg via INTRAVENOUS
  Filled 2017-02-24: qty 5

## 2017-02-24 MED ORDER — NICOTINE 21 MG/24HR TD PT24
21.0000 mg | MEDICATED_PATCH | Freq: Every day | TRANSDERMAL | Status: DC
  Administered 2017-02-26 – 2017-02-27 (×2): 21 mg via TRANSDERMAL
  Filled 2017-02-24 (×4): qty 1

## 2017-02-24 MED ORDER — HALOPERIDOL LACTATE 5 MG/ML IJ SOLN: 5.0000 mg | Freq: Once | INTRAMUSCULAR | Status: DC

## 2017-02-24 MED ORDER — CALCITONIN (SALMON) 200 UNIT/ML IJ SOLN
4.0000 [IU]/kg | Freq: Once | INTRAMUSCULAR | Status: AC
  Administered 2017-02-24: 344 [IU] via SUBCUTANEOUS
  Filled 2017-02-24: qty 1.72

## 2017-02-24 MED ORDER — SODIUM CHLORIDE 0.9% FLUSH
3.0000 mL | Freq: Two times a day (BID) | INTRAVENOUS | Status: DC
  Administered 2017-02-24 – 2017-02-28 (×6): 3 mL via INTRAVENOUS

## 2017-02-24 MED ORDER — CALCITONIN (SALMON) 200 UNIT/ML IJ SOLN: 4.0000 [IU]/kg | Freq: Once | INTRAMUSCULAR | Status: DC

## 2017-02-24 MED ORDER — POLYETHYLENE GLYCOL 3350 17 G PO PACK
17.0000 g | PACK | Freq: Every day | ORAL | Status: DC
  Administered 2017-02-24: 17 g via ORAL
  Filled 2017-02-24: qty 1

## 2017-02-24 MED ORDER — MORPHINE SULFATE (PF) 2 MG/ML IV SOLN
2.0000 mg | INTRAVENOUS | Status: DC | PRN
  Administered 2017-02-24 – 2017-02-27 (×4): 2 mg via INTRAVENOUS
  Filled 2017-02-24 (×4): qty 1

## 2017-02-24 MED ORDER — ORAL CARE MOUTH RINSE
15.0000 mL | Freq: Two times a day (BID) | OROMUCOSAL | Status: DC
  Administered 2017-02-25 – 2017-02-27 (×4): 15 mL via OROMUCOSAL

## 2017-02-24 NOTE — H&P (Signed)
TRH H&P   Patient Demographics:    Dennis Bell, is a 56 y.o. male  MRN: [id number]   DOB - 09-06-1960  Admit Date - 02/24/2017  Outpatient Primary MD for the patient is Patient, No Pcp Per  Referring MD: Dr Tyrone Nine  Outpatient Specialists: none   Patient coming from: home  Chief Complaint  Patient presents with  . Abdominal Pain      HPI:    Dennis Bell  is a 56 y.o. male, with hx of ?Hep C, tobacco abuse, PTSD, Who was brought to the ED by his mother for worsening abdominal pain, generalized weakness, poor by mouth intake and significant weight loss. Patient lives alone, is a very poor historian and has some confusion on evaluation and is not able to give enough information. On reviewing prior notes he was seen in [facility] on 12/28/2016 for abdominal pain. He was found to have a hep C and labs were done showing mild hypercalcemia (11) with transaminitis (AST/ALT/alkaline phosphatase of 125/140/202). He was then referred to [facility] Dr. Paulita Fujita who ordered for a CT of the abdomen and pelvis with contrast that was done on 5/15. (As per the note he had symptoms of generalized abdominal pain for almost 3 months with occasional fever, diarrhea,, nausea and vomiting). The CT scan showed hepatomegaly with diffuse hepatic metastasis versus multifocal hepatocellular carcinoma along with cirrhosis. Also showed bone metastases (left Iliac lytic lesion and right acetabulum), abdominal adenopathy (nodal metastases). Also showed nonspecific right lung base nodule. Also shows emphysematous changes. Repeat CT of the abdomen was done on 5/29 which shows mild progression of the liver metastases along with narrowing of proximal right portal vein reflecting partial thrombosis versus external mass effect from liver tumor. Patient tells me that Dr. Paulita Fujita wanted him to get liver biopsy  but he did not follow-up. Patient is somewhat confused and says he came to the hospital to get liver biopsy and then go home.  History has been very limited. He reports feeling extremely weak and tired. Has very poor appetite and worsening abdominal pain. Denies nausea or vomiting, diarrhea, fevers or chills.. Patient denies any headache, blurred vision, dizziness, chest pain, palpitations, shortness of breath, dysuria or loss of consciousness. Denies pain in his joints.   In the ED patient was mild tachycardic and tachypnea. He was afebrile, normal blood pressure and O2 sat on room air. Blood work showed WBC of 10.9, normal hemoglobin and platelets, sodium of 126, chloride 92, BUN of 22 and creatinine 1.27. Serum Calcium was greater than 15. LFTs showed AST of 133, ALT of 86, alkaline phosphatase of 166, total bilirubin of 2.7 and albumin of 3.2.  Patient given 4 mg IV morphine, 2 L IV normal saline bolus, 1 dose of IV calcitonin and 1 mg IV Ativan. Hospitalist admission requested to telemetry.   Review of systems:  In addition to the HPI above, Limited due to patient being a very poor historian. No Fever-chills, No Headache, No changes with Vision or hearing, No problems swallowing food or Liquids, poor appetite No Chest pain, Cough or Shortness of Breath, Abdominal pain (mainly in right upper quadrant), nausea+,  but no vomiting  Bowel movements are regular, No Blood in stool or Urine, No dysuria, No new skin rashes or bruises, No new joints pains-aches,  Generalized weakness, tingling, numbness in any extremity, Significant weight loss No polyuria, polydypsia or polyphagia, No significant Mental Stressors.  A full 10 point Review of Systems was done, except as stated above, all other Review of Systems were negative.   With Past History of the following :    Past Medical History:  Diagnosis Date  . Chronic back pain   . Hepatitis C   . PTSD (post-traumatic stress disorder)        Past Surgical History:  Procedure Laterality Date  . TONSILLECTOMY        Social History:     Social History  Substance Use Topics  . Smoking status: Current Every Day Smoker    Types: Cigarettes  . Smokeless tobacco: Never Used  . Alcohol use No   Denies IV drug use.  Lives - Alone  Mobility - at baseline ambulatory    Family History :     Family History  Problem Relation Age of Onset  . Hypertension Mother       Home Medications:   Prior to Admission medications   Not on File     Allergies:     Allergies  Allergen Reactions  . Codeine Other (See Comments)    "screws me up"  . Penicillins Nausea And Vomiting    Has patient had a PCN reaction causing immediate rash, facial/tongue/throat swelling, SOB or lightheadedness with hypotension: No Has patient had a PCN reaction causing severe rash involving mucus membranes or skin necrosis: No Has patient had a PCN reaction that required hospitalization: No Has patient had a PCN reaction occurring within the last 10 years: Yes If all of the above answers are "NO", then may proceed with Cephalosporin use.      Physical Exam:   Vitals  Blood pressure 118/90, pulse 94, temperature 97.5 F (36.4 C), temperature source Oral, resp. rate (!) 22, height 6' (1.829 m), weight 86.2 kg (190 lb), SpO2 99 %.   Gen.: Middle aged cachectic male lying in bed, restless HEENT: No pallor, mildly icteric, temporal wasting, poor dentition, dry oral mucosa Chest: Clear to auscultation bilaterally, no added sounds CVS: S1 and S2 tachycardic, no murmurs rub or gallop GI: Soft, tense abdomen, bowel sounds present, hepatomegaly with hard contour with ill-defined margins, tender to palpation, no splenomegaly Musculoskeletal: Warm, trace pedal edema bilaterally CNS: Alert and oriented 2-3 with some episodic confusion, no tremors   Data Review:    CBC  Recent Labs Lab 02/24/17 1338 02/24/17 1629  WBC 10.9*  --   HGB  14.2 16.0  HCT 42.8 47.0  PLT 246  --   MCV 97.5  --   MCH 32.3  --   MCHC 33.2  --   RDW 17.7*  --   LYMPHSABS 0.9  --   MONOABS 0.5  --   EOSABS 0.1  --   BASOSABS 0.0  --    ------------------------------------------------------------------------------------------------------------------  Chemistries   Recent Labs Lab 02/24/17 1338 02/24/17 1629  NA 126* 130*  K 4.6 4.4  CL 92*  93*  CO2 24  --   GLUCOSE 88 85  BUN 22* 28*  CREATININE 1.27* 1.30*  CALCIUM >15.0*  --   AST 133*  --   ALT 86*  --   ALKPHOS 166*  --   BILITOT 2.7*  --    ------------------------------------------------------------------------------------------------------------------ estimated creatinine clearance is 72.1 mL/min (A) (by C-G formula based on SCr of 1.3 mg/dL (H)). ------------------------------------------------------------------------------------------------------------------ No results for input(s): TSH, T4TOTAL, T3FREE, THYROIDAB in the last 72 hours.  Invalid input(s): FREET3  Coagulation profile No results for input(s): INR, PROTIME in the last 168 hours. ------------------------------------------------------------------------------------------------------------------- No results for input(s): DDIMER in the last 72 hours. -------------------------------------------------------------------------------------------------------------------  Cardiac Enzymes No results for input(s): CKMB, TROPONINI, MYOGLOBIN in the last 168 hours.  Invalid input(s): CK ------------------------------------------------------------------------------------------------------------------ No results found for: BNP   ---------------------------------------------------------------------------------------------------------------  Urinalysis    Component Value Date/Time   COLORURINE YELLOW 01/12/2017 2125   APPEARANCEUR CLEAR 01/12/2017 2125   LABSPEC 1.023 01/12/2017 2125   PHURINE 5.0 01/12/2017  2125   GLUCOSEU NEGATIVE 01/12/2017 2125   HGBUR NEGATIVE 01/12/2017 2125   [place] NEGATIVE 01/12/2017 2125   [place] NEGATIVE 01/12/2017 2125   PROTEINUR NEGATIVE 01/12/2017 2125   NITRITE NEGATIVE 01/12/2017 2125   LEUKOCYTESUR NEGATIVE 01/12/2017 2125    ----------------------------------------------------------------------------------------------------------------   Imaging Results:    No results found.  My personal review of EKG: Sinus tachycardia at 108, no ST-T changes   Assessment & Plan:    Principal Problem:  Severe Hypercalcemia Suspected due to dehydration and metastatic bony lesions. Place on aggressive IV hydration with normal saline (200 mL/h). Received calcitonin in the ED. Will repeat calcium level in next 6 hours and if still elevated will repeat her dose of calcitonin. Order a dose of IV zoledronic acid. Continue telemetry monitoring.  Active Problems:   Hepatocellular carcinoma ([place]) With osseous metastases as seen on CT abdomen. Check AFP. Likely contributed by hep C (as per note). Check hep C antibody. Monitor LFTs. Check INR and ammonia. I will consult [facility]. Patient will need biopsy of the liver for further diagnosis. Place on low-dose morphine when necessary for pain. Add bowel regimen. When necessary Phenergan for nausea/vomiting.    Failure to thrive in adult Likely secondary to metastatic malignancy. PT and nutrition evaluation. Continue IV fluids for now.  Acute encephalopathy Mild. Suspect due to dehydration, underlying malignancy and question mild hepatic encephalopathy. During my exam patient wished to go home to complete a few errands and come back to get admitted tomorrow. Since he is not oriented fully and not competent to make complex medical decision at this time, ED physician will be committing him involuntarily. When necessary Haldol for agitation.     Hyponatremia Secondary to dehydration. Monitor with IV fluids.     Protein-calorie malnutrition, severe ([place]) Nutrition consult.    AKI (acute kidney injury) ([place]) Secondary to dehydration. Also takes [facility] powder which is discontinued.  Tobacco abuse Order nicotine patch.   DVT Prophylaxis SCDs  AM Labs Ordered, also please review Full Orders  Family Communication: Admission, patients condition and plan of care including tests being ordered have been discussed with the patient and his mother at bedside  Code Status full code  Likely DC to  home  Condition GUARDED    Consults called: [facility], IR  Admission status: Inpatient  Time spent in minutes : 70   Louellen Molder M.D on 02/24/2017 at 5:22 PM  Between 7am to 7pm - Pager - [phone]. After 7pm go  to [email or web address] - password [email or web address]  [facility] - Office  [phone]

## 2017-02-24 NOTE — ED Triage Notes (Signed)
Pt presents with worsening generalized abdominal pain x 2 weeks.  Pt was supposed to have liver biopsy x 2 weeks ago and did not go.  Mother reports pt is very confused, weak.

## 2017-02-24 NOTE — ED Provider Notes (Signed)
[facility] DEPT Provider Note   CSN: [id number] Arrival date & time: 02/24/17  1302     History   Chief Complaint Chief Complaint  Patient presents with  . Abdominal Pain    HPI Dennis Bell is a 56 y.o. male.  56 yo M with a chief complaint of abdominal pain for the last 6 months or so. Worsening over the course of the past few weeks. Was seen in the ED about a month ago for the same. Denies vomiting denies fevers. Has had some weight loss weakness and confusion over the past 6 months or so. Has a history of cirrhosis.   The history is provided by the patient and a relative.  Abdominal Pain   This is a chronic problem. The current episode started more than 1 week ago. The problem occurs constantly. The problem has been gradually worsening. The pain is located in the generalized abdominal region. The quality of the pain is aching, sharp and shooting. The pain is at a severity of 8/10. The pain is moderate. Pertinent negatives include fever, diarrhea, vomiting, headaches, arthralgias and myalgias. Nothing aggravates the symptoms. Nothing relieves the symptoms. Past workup includes GI consult.    Past Medical History:  Diagnosis Date  . Chronic back pain   . Hepatitis C   . PTSD (post-traumatic stress disorder)     Patient Active Problem List   Diagnosis Date Noted  . Hypercalcemia 02/24/2017  . Hepatocellular carcinoma ([place]) 02/24/2017  . Failure to thrive in adult 02/24/2017  . Abdominal pain 02/24/2017  . Hyponatremia 02/24/2017  . Protein-calorie malnutrition, severe ([place]) 02/24/2017  . AKI (acute kidney injury) ([place]) 02/24/2017    Past Surgical History:  Procedure Laterality Date  . TONSILLECTOMY         Home Medications    Prior to Admission medications   Not on File    Family History Family History  Problem Relation Age of Onset  . Hypertension Mother     Social History Social History  Substance Use Topics  . Smoking status: Current  Every Day Smoker    Types: Cigarettes  . Smokeless tobacco: Never Used  . Alcohol use No     Allergies   Codeine and Penicillins   Review of Systems Review of Systems  Constitutional: Positive for activity change. Negative for chills and fever.  HENT: Negative for congestion and facial swelling.   Eyes: Negative for discharge and visual disturbance.  Respiratory: Negative for shortness of breath.   Cardiovascular: Negative for chest pain and palpitations.  Gastrointestinal: Positive for abdominal pain. Negative for diarrhea and vomiting.  Musculoskeletal: Negative for arthralgias and myalgias.  Skin: Negative for color change and rash.  Neurological: Positive for weakness. Negative for tremors, syncope and headaches.  Psychiatric/Behavioral: Negative for confusion and dysphoric mood.     Physical Exam Updated Vital Signs BP 118/90   Pulse 94   Temp 97.5 F (36.4 C) (Oral)   Resp (!) 22   Ht 6' (1.829 m)   Wt 86.2 kg (190 lb)   SpO2 99%   BMI 25.77 kg/m   Physical Exam  Constitutional: He appears cachectic.  HENT:  Head: Normocephalic and atraumatic.  Eyes: EOM are normal. Pupils are equal, round, and reactive to light.  Neck: Normal range of motion. Neck supple. No JVD present.  Cardiovascular: Normal rate and regular rhythm.  Exam reveals no gallop and no friction rub.   No murmur heard. Pulmonary/Chest: No respiratory distress. He has no wheezes.  Abdominal: He exhibits distension and mass. There is tenderness. There is guarding. There is no rebound.  Liver palpated below the umbilicus.   Musculoskeletal: Normal range of motion.  Neurological: He is alert.  Mildly confused to scenario  Skin: No rash noted. No pallor.  Psychiatric: He has a normal mood and affect. His behavior is normal.  Nursing note and vitals reviewed.    ED Treatments / Results  Labs (all labs ordered are listed, but only abnormal results are displayed) Labs Reviewed  CBC WITH  DIFFERENTIAL/PLATELET - Abnormal; Notable for the following:       Result Value   WBC 10.9 (*)    RDW 17.7 (*)    Neutro Abs 9.4 (*)    All other components within normal limits  COMPREHENSIVE METABOLIC PANEL - Abnormal; Notable for the following:    Sodium 126 (*)    Chloride 92 (*)    BUN 22 (*)    Creatinine, Ser 1.27 (*)    Calcium >15.0 (*)    Albumin 3.2 (*)    AST 133 (*)    ALT 86 (*)    Alkaline Phosphatase 166 (*)    Total Bilirubin 2.7 (*)    All other components within normal limits  PHOSPHORUS - Abnormal; Notable for the following:    Phosphorus 2.3 (*)    All other components within normal limits  MAGNESIUM - Abnormal; Notable for the following:    Magnesium 1.6 (*)    All other components within normal limits  I-STAT CHEM 8, ED - Abnormal; Notable for the following:    Sodium 130 (*)    Chloride 93 (*)    BUN 28 (*)    Creatinine, Ser 1.30 (*)    Calcium, Ion 2.02 (*)    All other components within normal limits  LIPASE, BLOOD  AMMONIA  PARATHYROID HORMONE, INTACT (NO CA)  TSH  URINALYSIS, ROUTINE W REFLEX MICROSCOPIC  PROTIME-INR  AFP TUMOR MARKER    EKG  EKG Interpretation  Date/Time:  Friday February 24 2017 16:22:12 EDT Ventricular Rate:  100 PR Interval:    QRS Duration: 96 QT Interval:  316 QTC Calculation: 408 R Axis:   77 Text Interpretation:  Sinus tachycardia Low voltage, precordial leads Borderline T abnormalities, inferior leads No old tracing to compare Confirmed by Deno Etienne [id number]) on 02/24/2017 4:28:39 PM       Radiology No results found.  Procedures Procedures (including critical care time)  Medications Ordered in ED Medications  morphine 4 MG/ML injection 4 mg (0 mg Intravenous Hold 02/24/17 1634)  ondansetron (ZOFRAN) injection 4 mg (0 mg Intravenous Hold 02/24/17 1634)  nicotine (NICODERM CQ - dosed in mg/24 hours) patch 21 mg (0 mg Transdermal Hold 02/24/17 1636)  calcitonin (MIACALCIN) injection 344 Units (not  administered)  haloperidol lactate (HALDOL) injection 2 mg (not administered)  sodium chloride 0.9 % bolus 2,000 mL (2,000 mLs Intravenous New Bag/Given 02/24/17 1629)  LORazepam (ATIVAN) injection 1 mg (1 mg Intravenous Given 02/24/17 1705)     Initial Impression / Assessment and Plan / ED Course  I have reviewed the triage vital signs and the nursing notes.  Pertinent labs & imaging results that were available during my care of the patient were reviewed by me and considered in my medical decision making (see chart for details).     56 yo M With a chief complaint of abdominal pain. Patient's last CT scan was concerning for hepatocellular carcinoma. At today's visit he has  a measurably high calcium. Clinically the patient looks like he most likely does have cancer. We'll give IV fluids as he seems dehydrated. Discuss with hospitalist for admission.  CRITICAL CARE Performed by: Cecilio Asper   Total critical care time: 35 minutes  Critical care time was exclusive of separately billable procedures and treating other patients.  Critical care was necessary to treat or prevent imminent or life-threatening deterioration.  Critical care was time spent personally by me on the following activities: development of treatment plan with patient and/or surrogate as well as nursing, discussions with consultants, evaluation of patient's response to treatment, examination of patient, obtaining history from patient or surrogate, ordering and performing treatments and interventions, ordering and review of laboratory studies, ordering and review of radiographic studies, pulse oximetry and re-evaluation of patient's condition.  The patients results and plan were reviewed and discussed.   Any x-rays performed were independently reviewed by myself.   Differential diagnosis were considered with the presenting HPI.  Medications  morphine 4 MG/ML injection 4 mg (0 mg Intravenous Hold 02/24/17 1634)    ondansetron (ZOFRAN) injection 4 mg (0 mg Intravenous Hold 02/24/17 1634)  nicotine (NICODERM CQ - dosed in mg/24 hours) patch 21 mg (0 mg Transdermal Hold 02/24/17 1636)  calcitonin (MIACALCIN) injection 344 Units (not administered)  haloperidol lactate (HALDOL) injection 2 mg (not administered)  sodium chloride 0.9 % bolus 2,000 mL (2,000 mLs Intravenous New Bag/Given 02/24/17 1629)  LORazepam (ATIVAN) injection 1 mg (1 mg Intravenous Given 02/24/17 1705)    Vitals:   02/24/17 1541 02/24/17 1600 02/24/17 1632 02/24/17 1645  BP: (!) 104/93 (!) 124/56 132/87 118/90  Pulse: 98 (!) 101 96 94  Resp: 18  (!) 21 (!) 22  Temp:      TempSrc:      SpO2: 99% 100% 98% 99%  Weight:      Height:        Final diagnoses:  Hypercalcemia  Palpable tender liver    Admission/ observation were discussed with the admitting physician, patient and/or family and they are comfortable with the plan.   Final Clinical Impressions(s) / ED Diagnoses   Final diagnoses:  Hypercalcemia  Palpable tender liver    New Prescriptions New Prescriptions   No medications on file     Deno Etienne, DO 02/24/17 1734

## 2017-02-24 NOTE — ED Notes (Signed)
Critical Lab value: number read back, physician alerted  Calcium greater than 15

## 2017-02-24 NOTE — ED Notes (Addendum)
Pt has ripped all wires and IV off of him. Pt is cussing and telling Dr he wants to go home and come back tomorrow. Pt has gone out into lobby and will not return back into his room. He is very verbal and saying leave him alone. Security is aware and so is AD. Pt is dripping blood all over the floor and in the lobby This in an infectious precaution for other pt's

## 2017-02-24 NOTE — ED Notes (Signed)
Patient getting antsy in waiting room. Informed him and his wife that we have a room ready for him and the tech is on their way to get him. Pt upset and got out of the wheelchair and walked outside. EMT able to have patient sit in wheelchair again so he could go back to his room.

## 2017-02-24 NOTE — ED Notes (Signed)
PT is refusing for another IV to be replaced.

## 2017-02-25 ENCOUNTER — Inpatient Hospital Stay (HOSPITAL_COMMUNITY): Payer: Medicare Other

## 2017-02-25 ENCOUNTER — Encounter (HOSPITAL_COMMUNITY): Payer: Self-pay | Admitting: General Surgery

## 2017-02-25 LAB — COMPREHENSIVE METABOLIC PANEL
ALK PHOS: 131 U/L — AB (ref 38–126)
ALT: 66 U/L — AB (ref 17–63)
ANION GAP: 9 (ref 5–15)
AST: 104 U/L — ABNORMAL HIGH (ref 15–41)
Albumin: 2.5 g/dL — ABNORMAL LOW (ref 3.5–5.0)
BUN: 15 mg/dL (ref 6–20)
CALCIUM: 12.6 mg/dL — AB (ref 8.9–10.3)
CHLORIDE: 100 mmol/L — AB (ref 101–111)
CO2: 24 mmol/L (ref 22–32)
CREATININE: 0.97 mg/dL (ref 0.61–1.24)
GFR calc Af Amer: >60 mL/min (ref >60)
Glucose, Bld: 84 mg/dL (ref 65–99)
Potassium: 4 mmol/L (ref 3.5–5.1)
SODIUM: 133 mmol/L — AB (ref 135–145)
Total Bilirubin: 2.7 mg/dL — ABNORMAL HIGH (ref 0.3–1.2)
Total Protein: 6.6 g/dL (ref 6.5–8.1)

## 2017-02-25 LAB — CBC
HCT: 37.3 % — ABNORMAL LOW (ref 39.0–52.0)
HEMOGLOBIN: 12.3 g/dL — AB (ref 13.0–17.0)
MCH: 32.5 pg (ref 26.0–34.0)
MCHC: 33 g/dL (ref 30.0–36.0)
MCV: 98.4 fL (ref 78.0–100.0)
PLATELETS: 212 10*3/uL (ref 150–400)
RBC: 3.79 MIL/uL — AB (ref 4.22–5.81)
RDW: 18 % — ABNORMAL HIGH (ref 11.5–15.5)
WBC: 8.1 10*3/uL (ref 4.0–10.5)

## 2017-02-25 LAB — AFP TUMOR MARKER: AFP-Tumor Marker: 86531 ng/mL — ABNORMAL HIGH (ref 0.0–8.3)

## 2017-02-25 LAB — PARATHYROID HORMONE, INTACT (NO CA): PTH: 13 pg/mL — AB (ref 15–65)

## 2017-02-25 LAB — PROTIME-INR
INR: 1.26
PROTHROMBIN TIME: 15.9 s — AB (ref 11.4–15.2)

## 2017-02-25 LAB — HIV ANTIBODY (ROUTINE TESTING W REFLEX): HIV SCREEN 4TH GENERATION: NONREACTIVE

## 2017-02-25 IMAGING — MR MR MRCP
5 of 10 series · 23 of 48 positions shown · non-contrast
Comparison: CT on 01/24/2017 and 01/10/2017

CLINICAL DATA: Hepatocellular carcinoma metastatic to bone.

EXAM:
MRI ABDOMEN WITHOUT CONTRAST  (INCLUDING MRCP)
TECHNIQUE: Multiplanar multisequence MR imaging of the abdomen was performed.
Heavily T2-weighted images of the biliary and pancreatic ducts were
obtained, and three-dimensional MRCP images were rendered by post
processing. Patient refused to continue exam due to pain, and
therefore no intravenous contrast was administered.

[Series 4: cor ssfse nav · coronal · 6.0mm · 0.78mm/px · 4 of 37 slices shown]
[im 1/37]
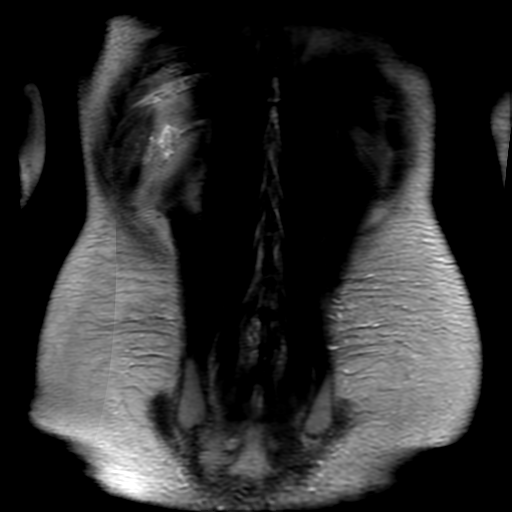
[im 13/37]
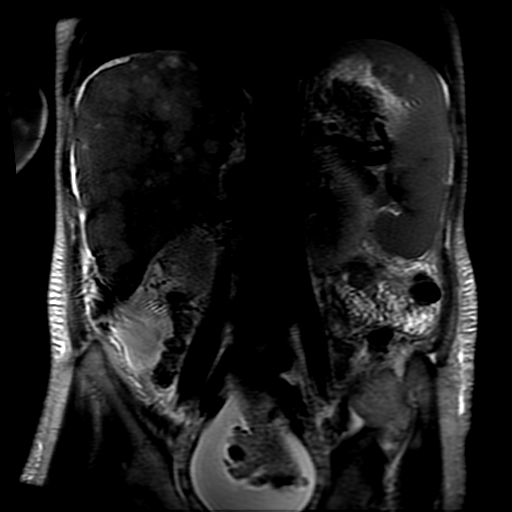
[im 25/37]
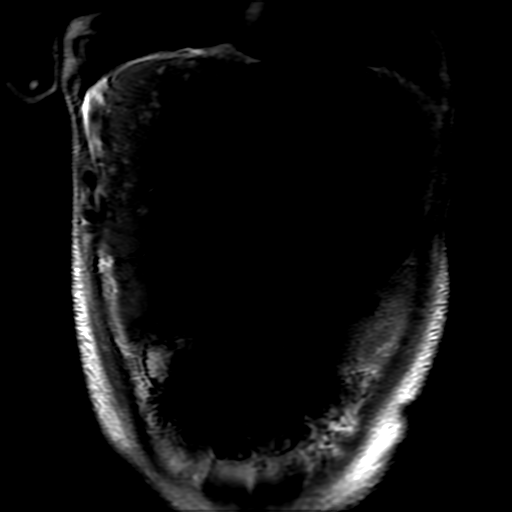
[im 37/37]
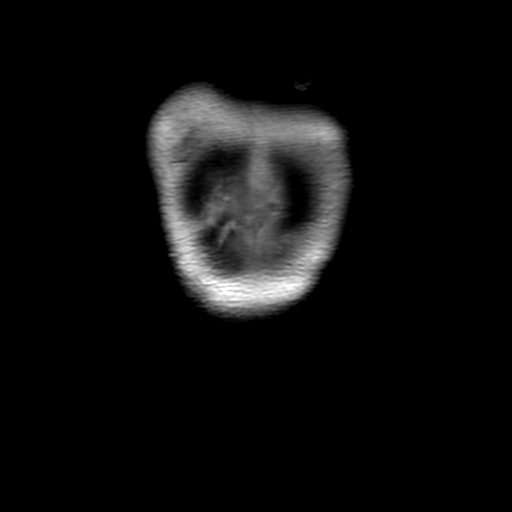

[Series 6: ax ssfse nav · axial · 6.0mm · 0.78mm/px · z∈[-77,+211]mm · 4 of 49 slices shown]
[im 1/49]
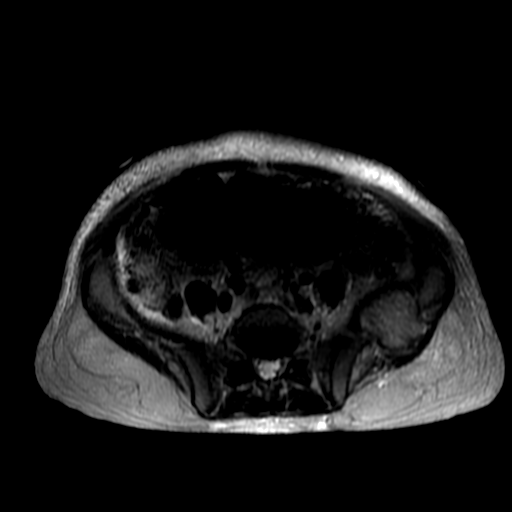
[im 17/49]
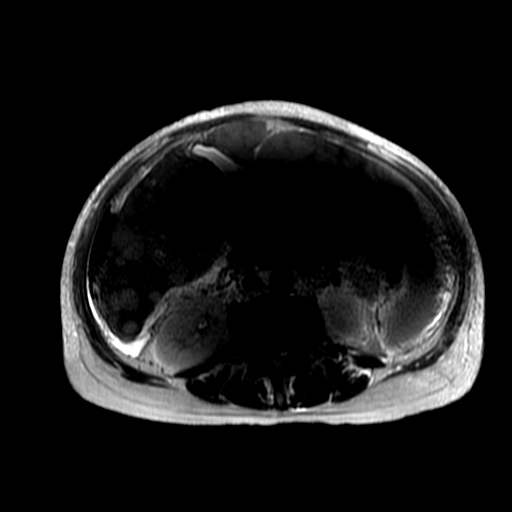
[im 33/49]
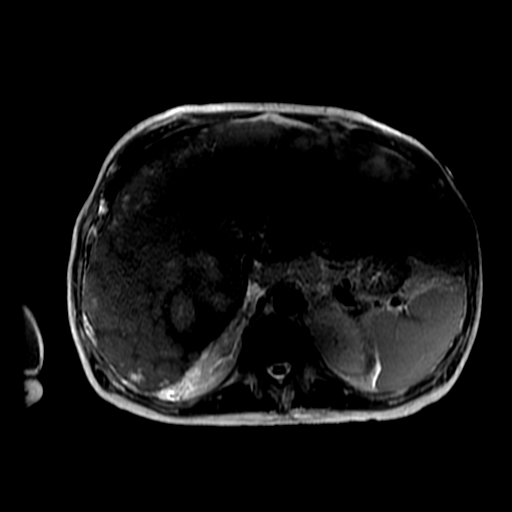
[im 49/49]
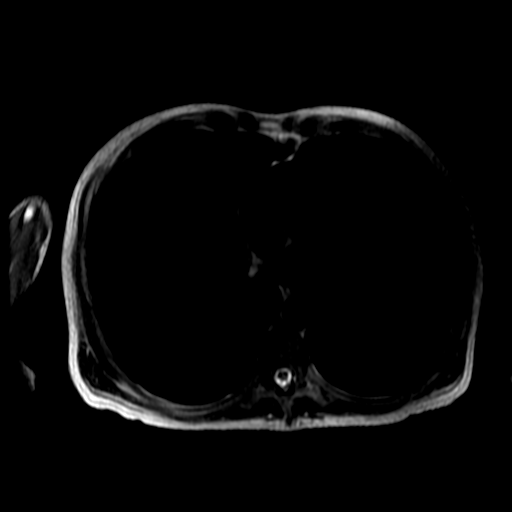

[Series 8: T2 fat-sat · axial · 6.0mm · 0.78mm/px · z∈[-14,+184]mm · 2 of 34 slices shown]
[im 1/34]
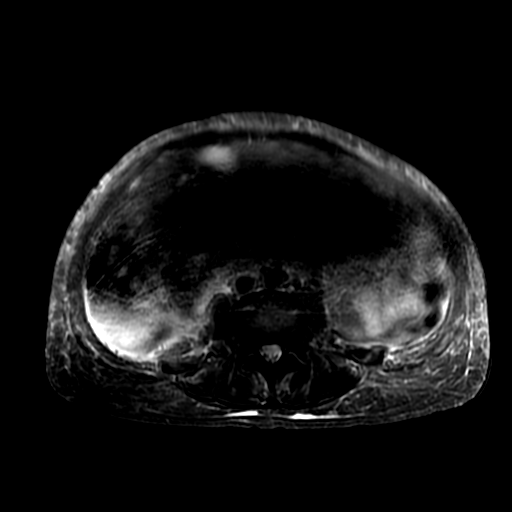
[im 34/34]
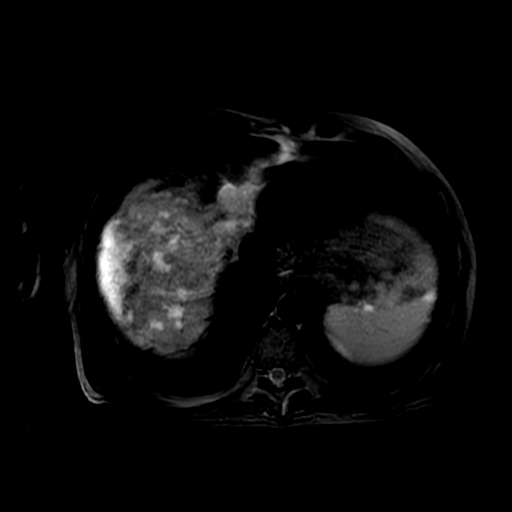

[Series 9: DWI b500 · axial · 6.0mm · 1.56mm/px · z∈[-77,+211]mm · 7 of 98 slices shown]
[im 1/98]
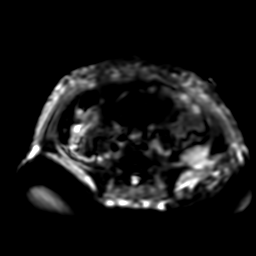
[im 17/98]
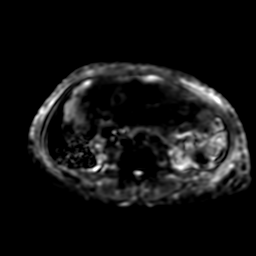
[im 33/98]
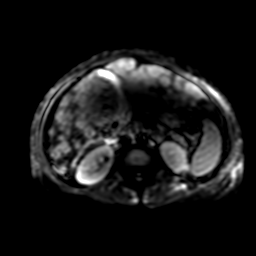
[im 49/98]
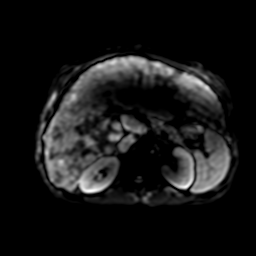
[im 65/98]
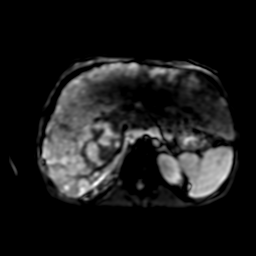
[im 81/98]
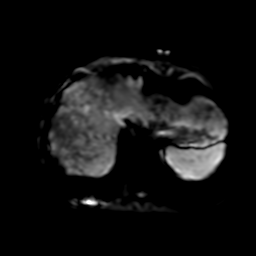
[im 98/98]
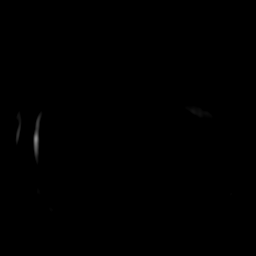

[Series 10: T1 dynamic · axial · 5.0mm · 0.82mm/px · z∈[-51,+162]mm · 6 of 104 slices shown]
[im 1/104]
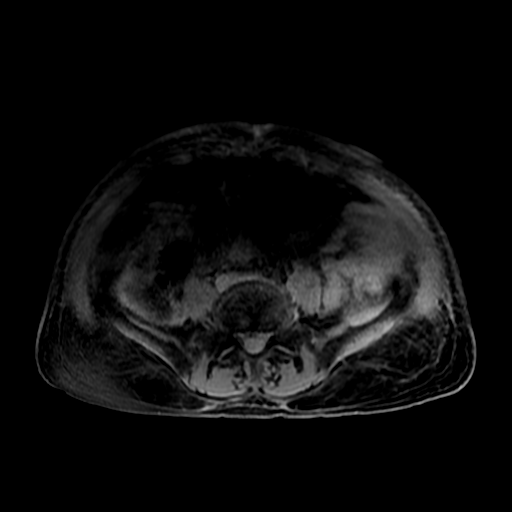
[im 18/104]
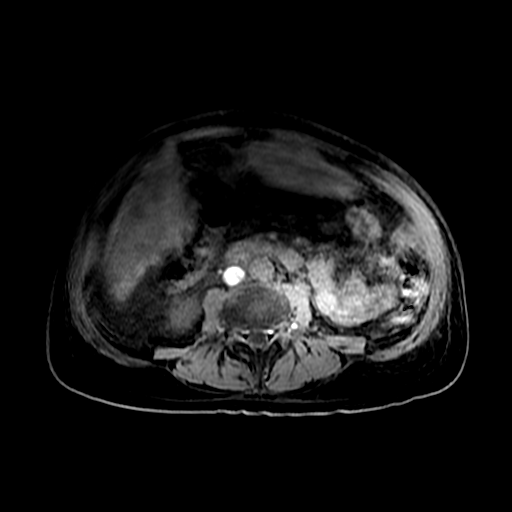
[im 35/104]
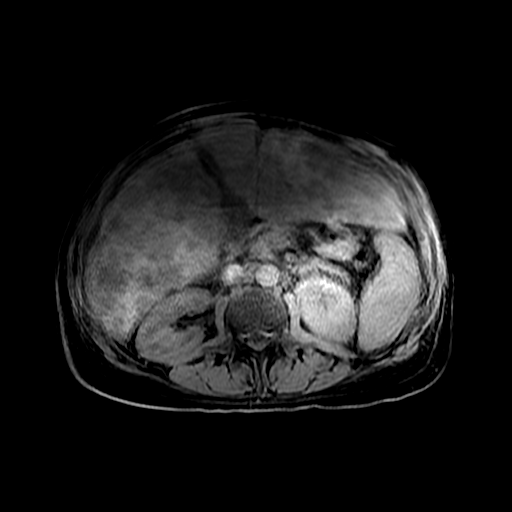
[im 52/104]
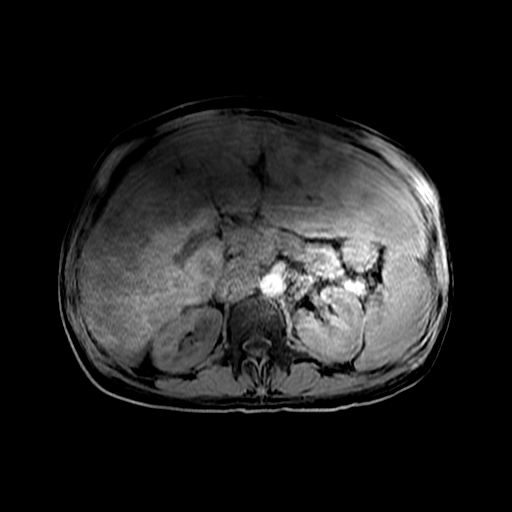
[im 69/104]
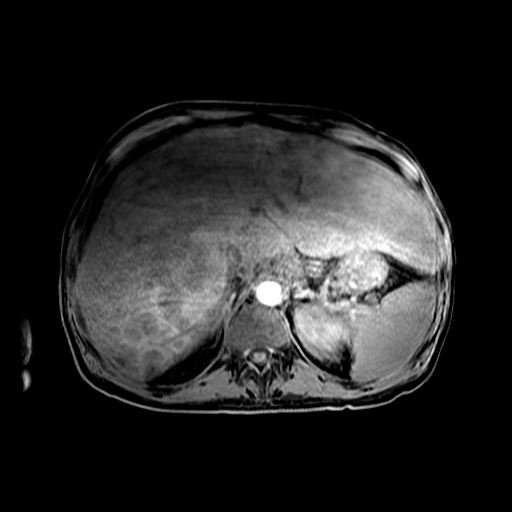
[im 86/104]
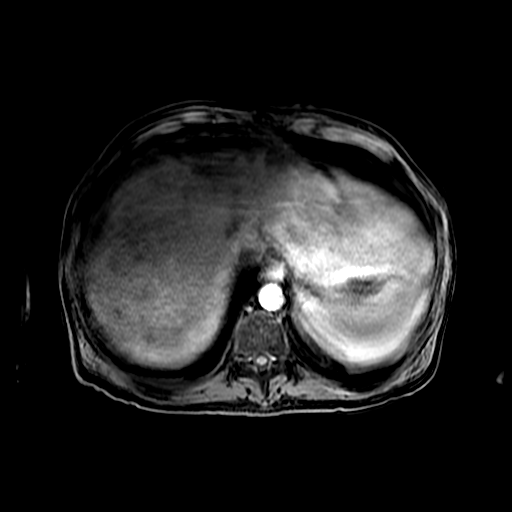

[23 of 48 positions shown; findings below may reference images not displayed]

FINDINGS: Lower chest: Tiny right pleural effusion.

Hepatobiliary: Image degradation by respiratory motion artifact
noted. Hepatomegaly again demonstrated. Numerous T2 hyperintense
soft tissue masses are seen involving the liver diffusely.
Comparison with previous CT is limited by differences in modalities.
Allowing for this, no significant interval change is demonstrated.
Gallbladder is unremarkable. No evidence of biliary ductal
dilatation.

Pancreas: No mass or inflammatory process visualized on this
unenhanced exam.

Spleen:  Within normal limits in size.

Adrenals/Urinary tract: Unremarkable. No evidence of urolithiasis or
hydronephrosis.

Stomach/Bowel: No evidence of obstruction, inflammatory process, or
abnormal fluid collections.

Vascular/Lymphatic: Stable lymphadenopathy in porta hepatis with
index lymph node measuring 2.8 cm on image [DATE]. Retroperitoneal
lymphadenopathy again seen in the pericaval space, with index lymph
node measuring 1.9 cm on image [DATE]. This also shows no significant
change compared to previous study.

No evidence of abdominal aortic aneurysm.

Other: Mild perihepatic ascites is increased. Increased diffuse body
wall edema. Pelvic ascites is also seen. An irregular soft tissue
mass is seen involving bowel loops in the central pelvis which
mentions approximately cm common is suspicious for metastatic
disease.

Musculoskeletal: Lytic bone metastasis and associated soft tissue
mass involving the left ilium has increased in size since previous
study, currently measuring 6 cm compared to 3 cm previously. This is
consistent with a bone metastasis.
IMPRESSION: Image degradation by motion artifact noted. Hepatomegaly and
innumerable diffuse hepatic masses show no significant change since
prior CT allowing for differences in modalities.

Porta hepatis and retroperitoneal lymphadenopathy, without
significant change.

New 8 cm irregular soft tissue mass in the central pelvis involving
bowel loops, highly suspicious for metastatic disease. Pelvis CT is
suggested further evaluation.

Increased size of lytic bone metastasis involving the left ilium.

Interval increase in mild ascites, diffuse body wall edema, and tiny
right pleural effusion.

## 2017-02-25 IMAGING — CT CT HEAD WO/W CM
3 of 4 series · 15 of 47 positions shown, 18 images · IV contrast (Omni 300)
Comparison: 12/10/2016 CT head

CLINICAL DATA: 55 y/o M; hepatocellular carcinoma. Concern for
intracranial metastasis.

EXAM:
CT HEAD WITHOUT AND WITH CONTRAST
TECHNIQUE: Contiguous axial images were obtained from the base of the skull
through the vertex without and with intravenous contrast
CONTRAST:  75mL 8XGCGL-FLL IOPAMIDOL (8XGCGL-FLL) INJECTION 61%

[Series 3: head without without · axial · non-contrast · 0.48mm/px · z∈[-114,+16]mm · 9 of 32 slices shown, 12 images]
[im 3/32  brain]
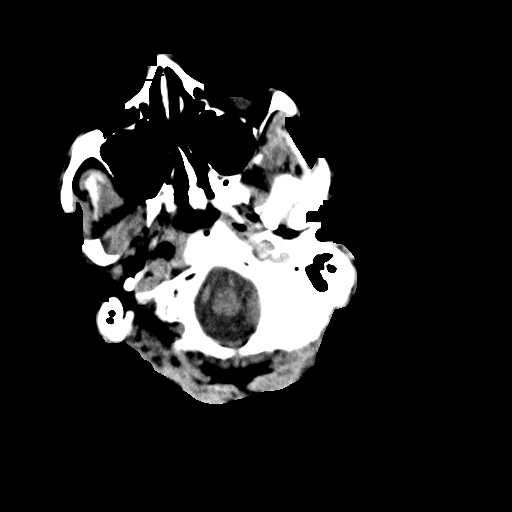
[im 3/32  bone]
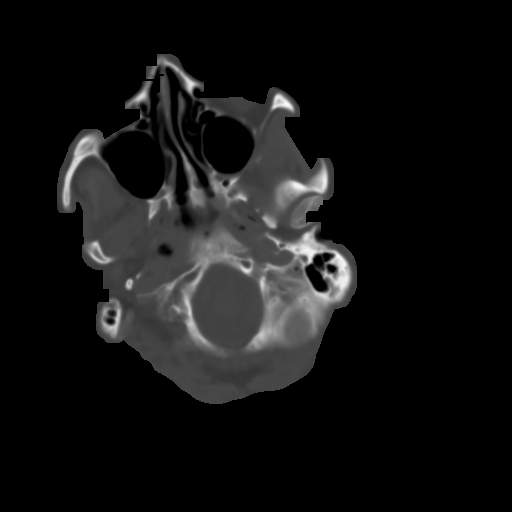
[im 7/32  brain]
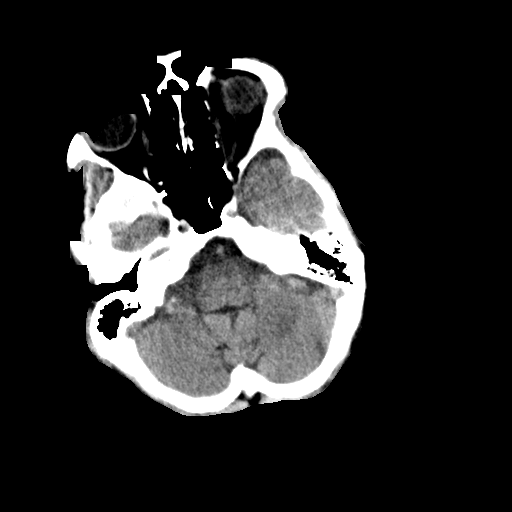
[im 9/32  brain]
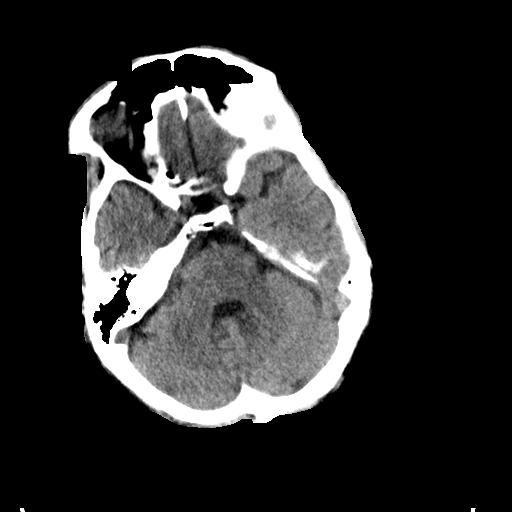
[im 14/32  brain]
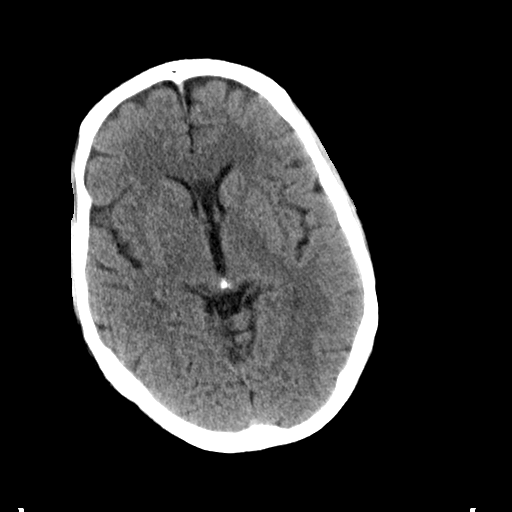
[im 16/32  brain]
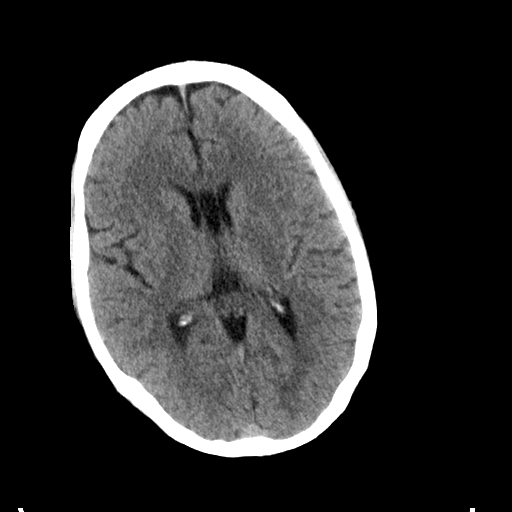
[im 16/32  bone]
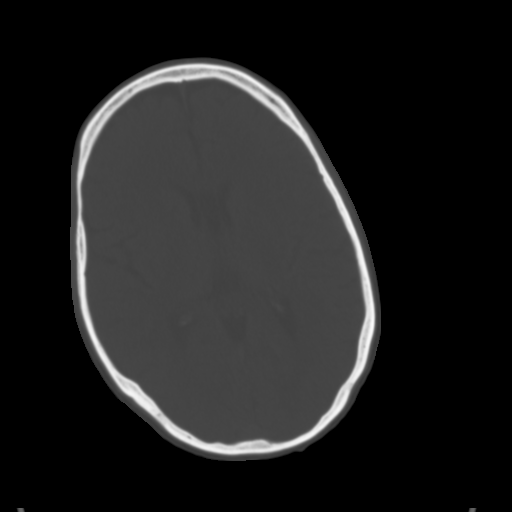
[im 18/32  brain]
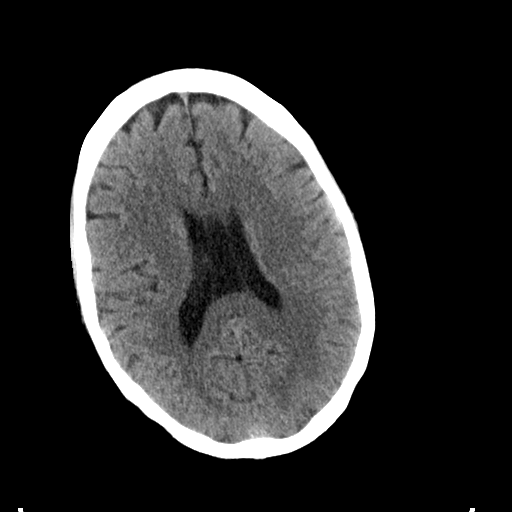
[im 23/32  brain]
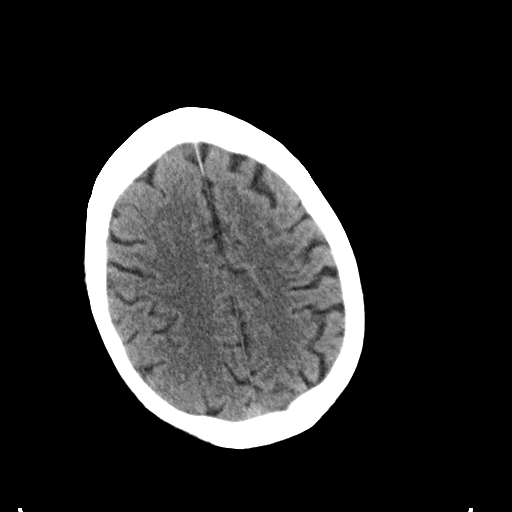
[im 25/32  brain]
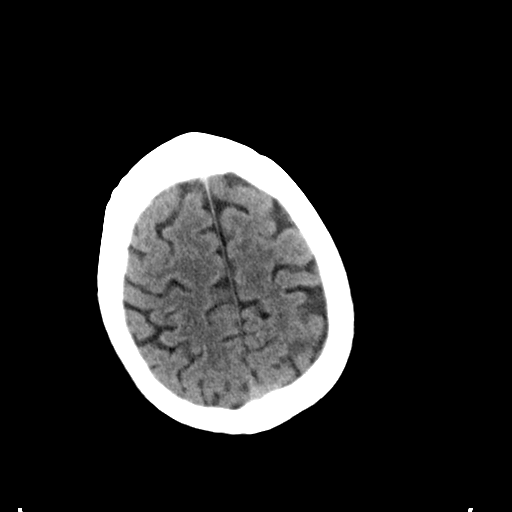
[im 29/32  brain]
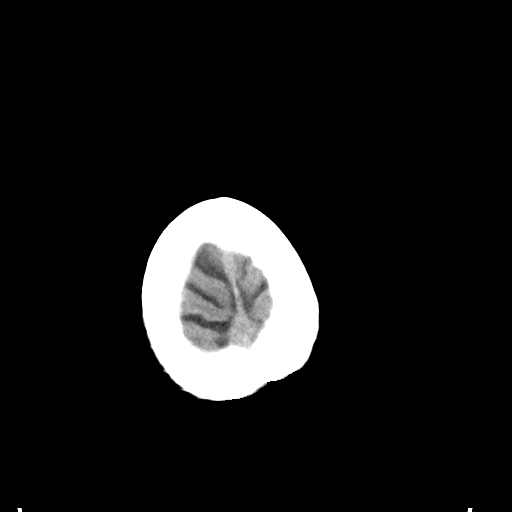
[im 29/32  bone]
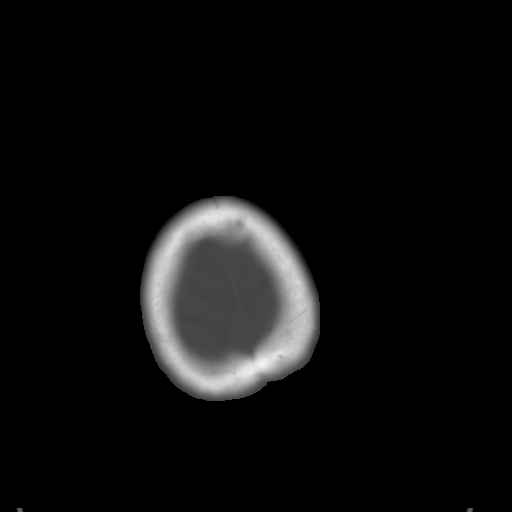

[Series 5: head with cor · coronal · 0.31mm/px · 3 of 68 slices shown]
[im 23/68  brain]
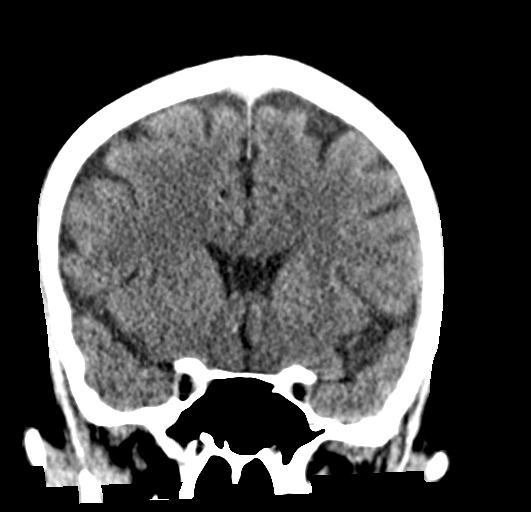
[im 30/68  brain]
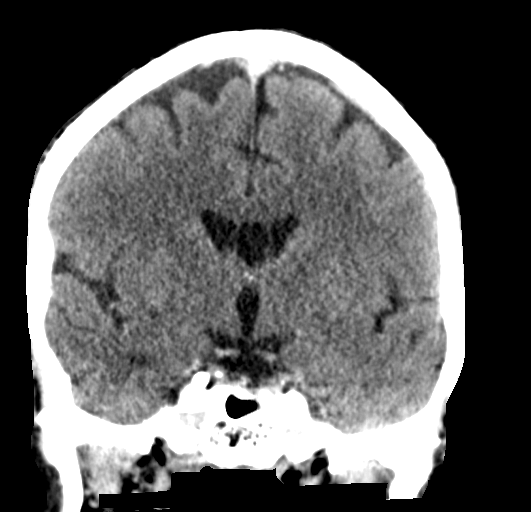
[im 38/68  brain]
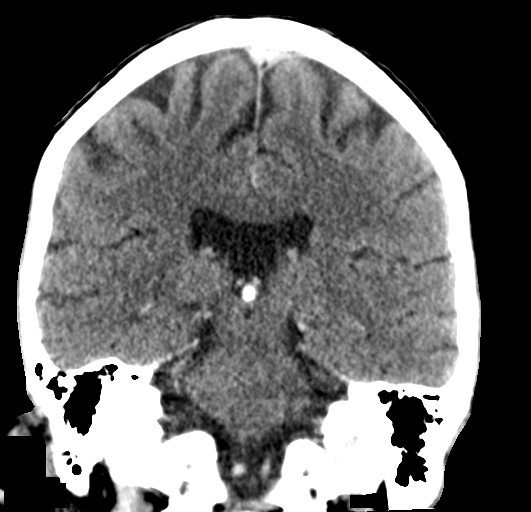

[Series 6: head with sag · sagittal · 0.31mm/px · 3 of 53 slices shown]
[im 18/53  brain]
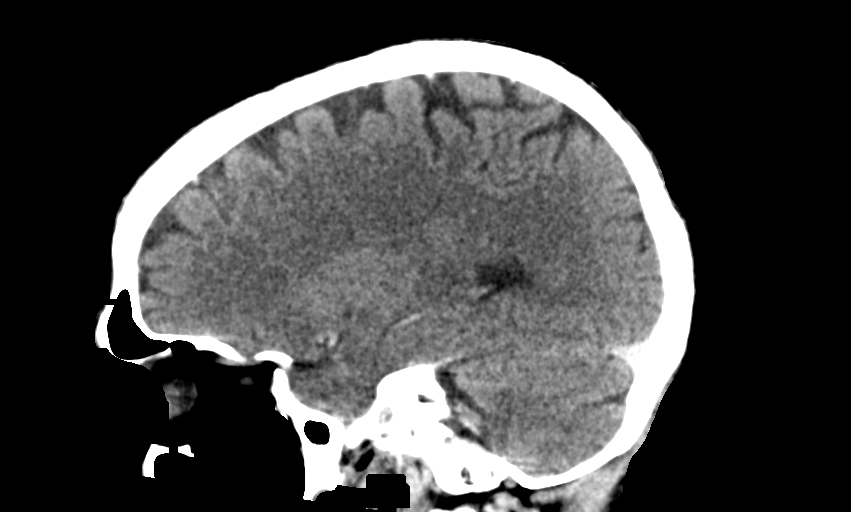
[im 27/53  brain]
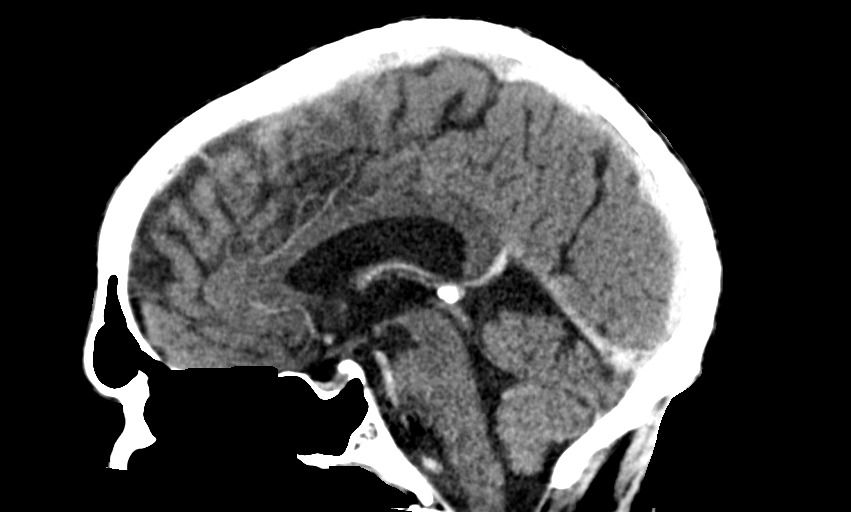
[im 35/53  brain]
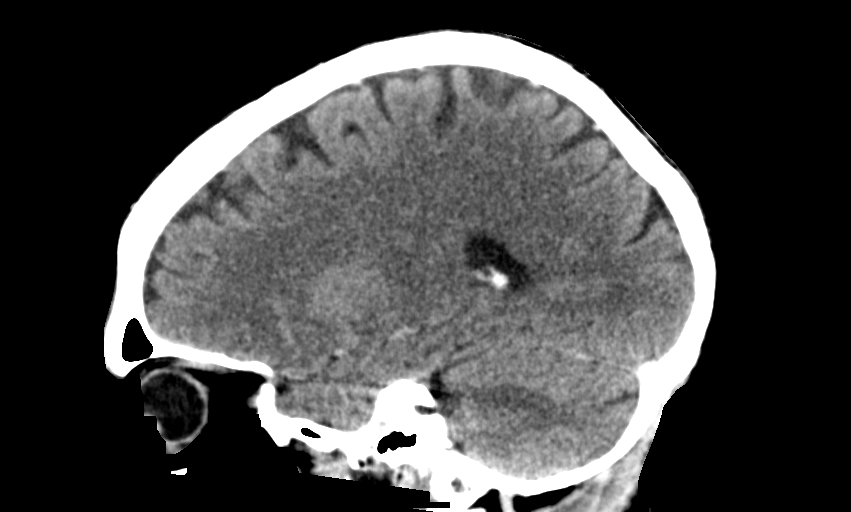

[15 of 47 positions shown; findings below may reference images not displayed]

FINDINGS: Brain: No evidence of acute infarction, hemorrhage, hydrocephalus,
extra-axial collection or mass lesion/mass effect. After
administration of intravenous contrast there is no abnormal
enhancement of the brain.

Vascular: Mild calcific atherosclerosis of the carotid siphons.

Skull: Normal. Negative for fracture or focal lesion.

Sinuses/Orbits: No acute finding.

Other: None.
IMPRESSION: No intracranial metastatic disease identified. Unremarkable CT of
the head with and without contrast.

By: Ning Schlueter M.D.

## 2017-02-25 MED ORDER — LORAZEPAM 1 MG PO TABS: 1.0000 mg | ORAL_TABLET | Freq: Four times a day (QID) | ORAL | Status: AC | PRN

## 2017-02-25 MED ORDER — PNEUMOCOCCAL VAC POLYVALENT 25 MCG/0.5ML IJ INJ
0.5000 mL | INJECTION | Freq: Once | INTRAMUSCULAR | Status: DC
  Filled 2017-02-25: qty 0.5

## 2017-02-25 MED ORDER — ADULT MULTIVITAMIN W/MINERALS CH
1.0000 | ORAL_TABLET | Freq: Every day | ORAL | Status: DC
  Administered 2017-02-26: 1 via ORAL
  Filled 2017-02-25 (×3): qty 1

## 2017-02-25 MED ORDER — THIAMINE HCL 100 MG/ML IJ SOLN: 100.0000 mg | Freq: Every day | INTRAMUSCULAR | Status: DC

## 2017-02-25 MED ORDER — IOPAMIDOL (ISOVUE-300) INJECTION 61%
INTRAVENOUS | Status: AC
  Administered 2017-02-25: 75 mL
  Filled 2017-02-25: qty 75

## 2017-02-25 MED ORDER — LORAZEPAM 2 MG/ML IJ SOLN
0.0000 mg | Freq: Two times a day (BID) | INTRAMUSCULAR | Status: DC
  Filled 2017-02-25: qty 1

## 2017-02-25 MED ORDER — LACTULOSE 10 GM/15ML PO SOLN: 10.0000 g | Freq: Two times a day (BID) | ORAL | Status: DC | PRN

## 2017-02-25 MED ORDER — VITAMIN B-1 100 MG PO TABS
100.0000 mg | ORAL_TABLET | Freq: Every day | ORAL | Status: DC
  Administered 2017-02-26: 100 mg via ORAL
  Filled 2017-02-25 (×4): qty 1

## 2017-02-25 MED ORDER — ENSURE ENLIVE PO LIQD
237.0000 mL | Freq: Three times a day (TID) | ORAL | Status: DC
  Administered 2017-02-25 – 2017-02-27 (×5): 237 mL via ORAL

## 2017-02-25 MED ORDER — CALCITONIN (SALMON) 200 UNIT/ML IJ SOLN
4.0000 [IU]/kg | Freq: Once | INTRAMUSCULAR | Status: DC
  Filled 2017-02-25: qty 1.72

## 2017-02-25 MED ORDER — CALCITONIN (SALMON) 200 UNIT/ML IJ SOLN
4.0000 [IU]/kg | Freq: Once | INTRAMUSCULAR | Status: AC
Start: 2017-02-25 — End: 2017-02-25
  Administered 2017-02-25: 344 [IU] via SUBCUTANEOUS

## 2017-02-25 MED ORDER — LORAZEPAM 2 MG/ML IJ SOLN
0.0000 mg | Freq: Four times a day (QID) | INTRAMUSCULAR | Status: AC
  Administered 2017-02-26 (×2): 1 mg via INTRAVENOUS
  Filled 2017-02-25 (×2): qty 1

## 2017-02-25 MED ORDER — LORAZEPAM 2 MG/ML IJ SOLN: 1.0000 mg | Freq: Four times a day (QID) | INTRAMUSCULAR | Status: AC | PRN

## 2017-02-25 MED ORDER — ENSURE ENLIVE PO LIQD: 237.0000 mL | Freq: Two times a day (BID) | ORAL | Status: DC

## 2017-02-25 MED ORDER — FOLIC ACID 1 MG PO TABS
1.0000 mg | ORAL_TABLET | Freq: Every day | ORAL | Status: DC
  Administered 2017-02-26: 1 mg via ORAL
  Filled 2017-02-25 (×3): qty 1

## 2017-02-25 MED ORDER — LACTULOSE 10 GM/15ML PO SOLN
10.0000 g | Freq: Two times a day (BID) | ORAL | Status: DC
  Administered 2017-02-25 – 2017-02-26 (×3): 10 g via ORAL
  Filled 2017-02-25 (×4): qty 15

## 2017-02-25 NOTE — Consult Note (Addendum)
Referring Provider: Dr. Clementeen Graham Primary Care Physician:  Patient, No Pcp Per Primary Gastroenterologist:  Dr. Paulita Fujita   Reason for Consultation:  Hepatocellular carcinoma, abdominal pain  HPI: Dennis Bell is a 56 y.o. male with past medical history of chronic hepatitis C was diagnosed with possible metastatic liver lesions/ possible diffuse [place] admitted to the hospital with abdominal pain. GI is consulted for further evaluation  Patient seen and examined at bedside. Patient currently confused. Has a sitter at bedside. Not able to get any history from the patient. Records from our GI practice reviewed.  Patient was seen by Dr. Paulita Fujita earlier in the May for evaluation of hepatitis C and abdominal pain. CT scan was ordered which revealed diffuse liver cancer with metastases to the bones. Patient was advised to have inpatient workup back in the May but he declined. Patient was also scheduled to have outpatient MRI done for further evaluation as well as  outpatient liver biopsy. Meanwhile patient started noticing worsening abdominal pain and confusion and was brought into the ER by mother.  Upon initial evaluation, patient was found to have hypercalcemia.   Past Medical History:  Diagnosis Date  . Chronic back pain   . Hepatitis C   . PTSD (post-traumatic stress disorder)     Past Surgical History:  Procedure Laterality Date  . TONSILLECTOMY      Prior to Admission medications   Not on File    Scheduled Meds: . calcitonin  4 Units/kg Subcutaneous Once  . haloperidol lactate  5 mg Intravenous Once  . mouth rinse  15 mL Mouth Rinse BID  .  morphine injection  4 mg Intravenous Once  . nicotine  21 mg Transdermal Daily  . ondansetron (ZOFRAN) IV  4 mg Intravenous Once  . pneumococcal 23 valent vaccine  0.5 mL Intramuscular Tomorrow-1000  . polyethylene glycol  17 g Oral Daily  . sodium chloride flush  3 mL Intravenous Q12H   Continuous Infusions: . sodium chloride 200 mL/hr at  02/25/17 0737   PRN Meds:.haloperidol lactate, morphine injection, promethazine  Allergies as of 02/24/2017 - Review Complete 02/24/2017  Allergen Reaction Noted  . Codeine Other (See Comments) 01/11/2017  . Penicillins Nausea And Vomiting 09/07/2016    Family History  Problem Relation Age of Onset  . Hypertension Mother     Social History   Social History  . Marital status: Single    Spouse name: N/A  . Number of children: N/A  . Years of education: N/A   Occupational History  . Not on file.   Social History Main Topics  . Smoking status: Current Every Day Smoker    Types: Cigarettes  . Smokeless tobacco: Never Used  . Alcohol use No  . Drug use: Yes    Types: Marijuana  . Sexual activity: Not on file   Other Topics Concern  . Not on file   Social History Narrative  . No narrative on file    Review of Systems: not able to obtain   Physical Exam: Vital signs: Vitals:   02/24/17 2031 02/24/17 2258  BP: (!) 131/107 137/88  Pulse: (!) 104 99  Resp: 18 19  Temp: 97.7 F (36.5 C) 98 F (36.7 C)   Last BM Date:  (PTA) General:   Patient is confused but cooperative. Currently not in acute distress. Lungs:  Clear throughout to auscultation.   No wheezes, crackles, or rhonchi. No acute distress. Anterior exam only Heart:  Regular rate and rhythm; no murmurs,  clicks, rubs,  or gallops. Abdomen: Diffuse abdominal discomfort on palpation, mild distention noted, no peritoneal signs, bowel sounds present LE: no edema. Pulses intact Psych- confused Rectal:  Deferred  GI:  Lab Results:  Recent Labs  02/24/17 1338 02/24/17 1629 02/25/17 0318  WBC 10.9*  --  8.1  HGB 14.2 16.0 12.3*  HCT 42.8 47.0 37.3*  PLT 246  --  212   BMET  Recent Labs  02/24/17 1338 02/24/17 1629 02/24/17 2050 02/25/17 0318  NA 126* 130* 130* 133*  K 4.6 4.4 4.0 4.0  CL 92* 93* 95* 100*  CO2 24  --  23 24  GLUCOSE 88 85 88 84  BUN 22* 28* 22* 15  CREATININE 1.27* 1.30*  1.09 0.97  CALCIUM >15.0*  --  13.8* 12.6*   LFT  Recent Labs  02/25/17 0318  PROT 6.6  ALBUMIN 2.5*  AST 104*  ALT 66*  ALKPHOS 131*  BILITOT 2.7*   PT/INR  Recent Labs  02/24/17 2050 02/25/17 0318  LABPROT 15.8* 15.9*  INR 1.26 1.26     Studies/Results: No results found.  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 Patient with elevated alpha-fetoprotein of 86,000 - Encephalopathy. Most likely from hypercalcemia. Patient with on and off agitation which does not go with hepatic encephalopathy - Abnormal LFTs. Most likely from underlying diffuse hepatic metastasis - Chronic  hepatitis C. Genotype 1. Treatment nave. - Diffuse abdominal pain.  Recommendations ------------------------ - Ideally MRI-MRCP would help for further evaluation, but patient may not be able to follow the commands for study protocol. - Agree with interventional radiology consult for possible liver biopsy to confirm the diagnosis. - I do not think patient has hepatic encephalopathy but will try low-dose lactulose, titrate to have 2-3 small bowel movements per day. - GI will follow   LOS: 1 day   Otis Brace  MD, FACP 02/25/2017, 7:44 AM  Pager [phone] If no answer or after 5 PM call [phone]

## 2017-02-25 NOTE — Progress Notes (Signed)
Admitted pt.from ED ,alert ,but sleepy,oriented to person,disoriented to place & time,No acute distress,v/s taken & recorded.IV in place on LT.AC ;w/ occlusive dsd.in place.no redness,or swelling noted.Skin intact except abrasion on his nose from fall .Was able to do admission assessment & history with the help of mother by phone.Placed pt.on heart monitor.Will continue to monitor pt.

## 2017-02-25 NOTE — Progress Notes (Addendum)
PROGRESS NOTE  Dennis Bell  YIR:[id number] DOB: 1960/10/02 DOA: 02/24/2017 PCP: Patient, No Pcp Per  Brief Narrative:   The patient is a 56 year old male with history of hepatitis C, tobacco abuse, PTSD who is brought to the emergency department by his mother for abdominal pain, generalized weakness, weight loss, and anorexia.  He was seen at [facility] on 12/28/2016 for abdominal pain and found to have hepatitis C, hypercalcemia, transaminitis. He had a CT of the abdomen and pelvis done on 5/15 which demonstrated hepatomegaly with diffuse hepatic metastasis versus multifocal hepatocellular carcinoma with cirrhosis. He had lytic lesions of the left iliac crest, right acetabulum. He has had abdominal adenopathy and a nonspecific right lung base nodule. 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 He did not follow-up for liver biopsy.  In the emergency department, he had a mild tachycardia and tachypnea. His sodium was 126 and his calcium was greater than 15. He continues to have a transaminitis with a total bilirubin of 2.7. He was given IV morphine, IV fluids, calcitonin, Ativan.  His calcium has trended down to 12.6. Patient had to be involuntarily committed secondary to agitation, confusion, attempting to leave.  Radiology has been consulted for a biopsy of his liver mass, however this cannot be done until Monday or Tuesday. 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    Assessment & Plan:   Principal Problem:   Hypercalcemia Active Problems:   Hepatocellular carcinoma (HCC)   Failure to thrive in adult   Abdominal pain   Hyponatremia   Protein-calorie malnutrition, severe (HCC)   AKI (acute kidney injury) ([place])  Severe Hypercalcemia, ongoing agitation and  confusion.  Calcium trending down - Zometa administered on 6/29 - Calcitonin administered on 6/29 - We will give 1 additional dose of calcitonin on 6/30 -  Continue IV fluids  Hepatocellular carcinoma ([place]), due to underlying HCV.  With osseous metastases as seen on CT abdomen. - AFP 86,000  -  Appreciate GI assistance -  Appreciate Radiology assistance.  Possible biopsy on Monday or Tuesday  Unclear if patient was also an alcoholic -  CIWA  -  Thiamine, folate, MVI  Acute metabolic encephalopathy due to hypercalcemia and possible hepatic encephalopathy.   -  Oriented to person and June, but not place or date or situation -  Therapist, sports requesting additional prn medications for agitation -  Sitter remains at bedside -  Treating hypercalcemia as above -  Agree with lactulose started by GI  Failure to thrive in adult PT evaluation    Hyponatremia due to cirrhosis and dehydration Continue IV fluids.    Protein-calorie malnutrition, severe ([place]) Nutrition consult. - Regular diet with supplements    AKI (acute kidney injury) ([place]), resolved Secondary to dehydration. Also takes [facility] powder which is discontinued. -  Continue IV fluids and repeat BMP in a.m.  Tobacco abuse Order nicotine patch.  Normocytic anemia -  Iron studies, B12, folate -  Occult stool -  Repeat hgb in AM  TSH elevated in the setting of severe illness, malignancy, dehydration, hypercalcemia -  Repeat TSH in 2-4 weeks  DVT prophylaxis:  SCDs Code Status:  full Family Communication:  Patient.  Called patient's mother at phone numbers listed in [place], unable to reach her. Left  her a message stating hours trying to get in touch with her. Disposition Plan:  Unclear disposition at this point. Patient remains confused. Needs physical therapy evaluation.     Consultants:   Gastroenterology, Dr. Alessandra Bevels  Radiology  Procedures:  None  Antimicrobials:  Anti-infectives    None        Subjective: Since confused. He cannot tell me why he is in the hospital.  He did not appear to recall his previous hospitalizations at [facility] and his outpatient visit with gastroenterology last month. He seems surprised when I told him that he likely has metastatic cancer and we are trying to get a tissue biopsy for diagnosis and to assess what treatment options could be available. He would like to leave. He declines any further treatments. He does not want me contacting his next of kin.  Objective: Vitals:   02/24/17 1830 02/24/17 2031 02/24/17 2258 02/25/17 1200  BP: (!) 132/98 (!) 131/107 137/88 125/84  Pulse:  (!) 104 99 98  Resp: (!) 25 18 19    Temp:  97.7 F (36.5 C) 98 F (36.7 C)   TempSrc:  Oral Axillary   SpO2:  98% 99%   Weight:      Height:        Intake/Output Summary (Last 24 hours) at 02/25/17 1326 Last data filed at 02/25/17 0517  Gross per 24 hour  Intake          3706.67 ml  Output              450 ml  Net          3256.67 ml   Filed Weights   02/24/17 1318  Weight: 86.2 kg (190 lb)    Examination:  General exam:  Adult Male. Cachectic. No acute distress.  HEENT:  NCAT, MMM Respiratory system: Clear to auscultation bilaterally Cardiovascular system: Regular rate and rhythm, normal S1/S2. No murmurs, rubs, gallops or clicks.  Warm extremities Gastrointestinal system: Normal active bowel sounds, soft, enlarged liver, nontender MSK:  Normal tone and bulk, trace bilateral lower extremity edema Neuro:  Grossly moves all extremities.  Somewhat slow to answer questions. Has difficulty focusing his eyes at times. Eyelids droop.    Data Reviewed: I have personally reviewed following labs and imaging studies  CBC:  Recent Labs Lab 02/24/17 1338 02/24/17 1629 02/25/17 0318  WBC 10.9*  --  8.1  NEUTROABS 9.4*  --   --   HGB 14.2 16.0 12.3*  HCT 42.8 47.0 37.3*  MCV 97.5  --  98.4  PLT 246  --  510   Basic Metabolic Panel:  Recent  Labs Lab 02/24/17 1338 02/24/17 1623 02/24/17 1629 02/24/17 2050 02/25/17 0318  NA 126*  --  130* 130* 133*  K 4.6  --  4.4 4.0 4.0  CL 92*  --  93* 95* 100*  CO2 24  --   --  23 24  GLUCOSE 88  --  85 88 84  BUN 22*  --  28* 22* 15  CREATININE 1.27*  --  1.30* 1.09 0.97  CALCIUM >15.0*  --   --  13.8* 12.6*  MG  --  1.6*  --   --   --   PHOS  --  2.3*  --   --   --    GFR: Estimated Creatinine Clearance: 96.7 mL/min (by C-G formula based on SCr of 0.97 mg/dL). Liver Function Tests:  Recent Labs Lab 02/24/17 1338 02/25/17 2585  AST 133* 104*  ALT 86* 66*  ALKPHOS 166* 131*  BILITOT 2.7* 2.7*  PROT 7.8 6.6  ALBUMIN 3.2* 2.5*    Recent Labs Lab 02/24/17 1338  LIPASE 44    Recent Labs Lab 02/24/17 1605  AMMONIA 89*   Coagulation Profile:  Recent Labs Lab 02/24/17 2050 02/25/17 0318  INR 1.26 1.26   Cardiac Enzymes: No results for input(s): CKTOTAL, CKMB, CKMBINDEX, TROPONINI in the last 168 hours. BNP (last 3 results) No results for input(s): PROBNP in the last 8760 hours. HbA1C: No results for input(s): HGBA1C in the last 72 hours. CBG: No results for input(s): GLUCAP in the last 168 hours. Lipid Profile: No results for input(s): CHOL, HDL, LDLCALC, TRIG, CHOLHDL, LDLDIRECT in the last 72 hours. Thyroid Function Tests:  Recent Labs  02/24/17 1623  TSH 10.518*   Anemia Panel: No results for input(s): VITAMINB12, FOLATE, FERRITIN, TIBC, IRON, RETICCTPCT in the last 72 hours. Urine analysis:    Component Value Date/Time   COLORURINE YELLOW 02/24/2017 2044   APPEARANCEUR CLEAR 02/24/2017 2044   LABSPEC 1.009 02/24/2017 2044   PHURINE 6.0 02/24/2017 2044   GLUCOSEU NEGATIVE 02/24/2017 2044   HGBUR NEGATIVE 02/24/2017 2044   BILIRUBINUR NEGATIVE 02/24/2017 2044   KETONESUR 20 (A) 02/24/2017 2044   PROTEINUR NEGATIVE 02/24/2017 2044   NITRITE NEGATIVE 02/24/2017 2044   LEUKOCYTESUR NEGATIVE 02/24/2017 2044   Sepsis  Labs: @LABRCNTIP (procalcitonin:4,lacticidven:4)  )No results found for this or any previous visit (from the past 240 hour(s)).    Radiology Studies: No results found.   Scheduled Meds: . calcitonin  4 Units/kg Subcutaneous Once  . feeding supplement (ENSURE ENLIVE)  237 mL Oral BID BM  . folic acid  1 mg Oral Daily  . LORazepam  0-4 mg Intravenous Q6H   Followed by  . [START ON 02/27/2017] LORazepam  0-4 mg Intravenous Q12H  . mouth rinse  15 mL Mouth Rinse BID  .  morphine injection  4 mg Intravenous Once  . multivitamin with minerals  1 tablet Oral Daily  . nicotine  21 mg Transdermal Daily  . ondansetron (ZOFRAN) IV  4 mg Intravenous Once  . pneumococcal 23 valent vaccine  0.5 mL Intramuscular Tomorrow-1000  . sodium chloride flush  3 mL Intravenous Q12H  . thiamine  100 mg Oral Daily   Or  . thiamine  100 mg Intravenous Daily   Continuous Infusions: . sodium chloride 200 mL/hr at 02/25/17 0737     LOS: 1 day    Time spent: 30 min    Janece Canterbury, MD [facility] Pager [phone]  If 7PM-7AM, please contact night-coverage [email or web address] Password [email or web address] 02/25/2017, 1:26 PM

## 2017-02-25 NOTE — Consult Note (Signed)
Chief Complaint: liver masses  Referring Physician:Dr. Flonnie Overman Dhungel  Supervising Physician: Markus Daft  Patient Status: [facility] - In-pt  HPI: Dennis Bell is a 56 y.o. male who was admitted secondary to confusion, hypercalcemia, etc.  He has been followed by Dr. Paulita Fujita in May for abdominal pain.  He had a CT scan of his abdomen/pelvis that diffuse hepatic mets vs multifocal HCC with suspicions of cirrhosis.  He also has bony mets and abdominal adenopathy.  He was set up for an outpatient liver biopsy on 03-07-17; however, his mother brought him to the ED yesterday secondary to AMS and abdominal pain.  We have been asked to see him for liver biopsy.  Past Medical History:  Past Medical History:  Diagnosis Date  . Chronic back pain   . Hepatitis C   . PTSD (post-traumatic stress disorder)     Past Surgical History:  Past Surgical History:  Procedure Laterality Date  . TONSILLECTOMY      Family History:  Family History  Problem Relation Age of Onset  . Hypertension Mother     Social History:  reports that he has been smoking Cigarettes.  He has never used smokeless tobacco. He reports that he uses drugs, including Marijuana. He reports that he does not drink alcohol.  Allergies:  Allergies  Allergen Reactions  . Codeine Other (See Comments)    "screws me up"  . Penicillins Nausea And Vomiting    Has patient had a PCN reaction causing immediate rash, facial/tongue/throat swelling, SOB or lightheadedness with hypotension: No Has patient had a PCN reaction causing severe rash involving mucus membranes or skin necrosis: No Has patient had a PCN reaction that required hospitalization: No Has patient had a PCN reaction occurring within the last 10 years: Yes If all of the above answers are "NO", then may proceed with Cephalosporin use.     Medications: Medications reviewed in epic  ROS: unable to obtain as the patient is confused and doesn't answer many  questions  Mallampati Score: MD Evaluation Airway: WNL Heart: WNL Abdomen: Other (comments) Abdomen comments: hepatomegaly Chest/ Lungs: Other (comments) Chest/ lungs comments: diffuse rhonchi ASA  Classification: 3 Mallampati/Airway Score: Two  Physical Exam: BP 137/88 (BP Location: Left Arm)   Pulse 99   Temp 98 F (36.7 C) (Axillary)   Resp 19   Ht 6' (1.829 m)   Wt 190 lb (86.2 kg)   SpO2 99%   BMI 25.77 kg/m  Body mass index is 25.77 kg/m. General: confused, white male who is laying in bed in NAD HEENT: head is normocephalic, but nose with abrasion secondary to a fall yesterday.  Sclera are noninjected.  PERRL.  Ears and nose without any masses or lesions.  Mouth is pink and moist Heart: regular, rate, and rhythm.  Normal s1,s2. No obvious murmurs, gallops, or rubs noted.  Palpable radial pulses bilaterally Lungs: diffuse rhonchi.  Respiratory effort nonlabored Abd: soft, tender in upper abdomen, +BS, no masses, hernias, but significant hepatomegaly Psych: confused and kicks sheets around.  Doesn't know where he is or anything about his current situation   Labs: Results for orders placed or performed during the hospital encounter of 02/24/17 (from the past 48 hour(s))  CBC with Differential     Status: Abnormal   Collection Time: 02/24/17  1:38 PM  Result Value Ref Range   WBC 10.9 (H) 4.0 - 10.5 K/uL   RBC 4.39 4.22 - 5.81 MIL/uL   Hemoglobin 14.2 13.0 -  17.0 g/dL   HCT 42.8 39.0 - 52.0 %   MCV 97.5 78.0 - 100.0 fL   MCH 32.3 26.0 - 34.0 pg   MCHC 33.2 30.0 - 36.0 g/dL   RDW 17.7 (H) 11.5 - 15.5 %   Platelets 246 150 - 400 K/uL   Neutrophils Relative % 87 %   Neutro Abs 9.4 (H) 1.7 - 7.7 K/uL   Lymphocytes Relative 8 %   Lymphs Abs 0.9 0.7 - 4.0 K/uL   Monocytes Relative 4 %   Monocytes Absolute 0.5 0.1 - 1.0 K/uL   Eosinophils Relative 1 %   Eosinophils Absolute 0.1 0.0 - 0.7 K/uL   Basophils Relative 0 %   Basophils Absolute 0.0 0.0 - 0.1 K/uL   Comprehensive metabolic panel     Status: Abnormal   Collection Time: 02/24/17  1:38 PM  Result Value Ref Range   Sodium 126 (L) 135 - 145 mmol/L   Potassium 4.6 3.5 - 5.1 mmol/L   Chloride 92 (L) 101 - 111 mmol/L   CO2 24 22 - 32 mmol/L   Glucose, Bld 88 65 - 99 mg/dL   BUN 22 (H) 6 - 20 mg/dL   Creatinine, Ser 1.27 (H) 0.61 - 1.24 mg/dL   Calcium >15.0 (HH) 8.9 - 10.3 mg/dL    Comment: REPEATED TO VERIFY CRITICAL RESULT CALLED TO, READ BACK BY AND VERIFIED WITH: Debbe Bales RN [phone] [id number] GREEN R    Total Protein 7.8 6.5 - 8.1 g/dL   Albumin 3.2 (L) 3.5 - 5.0 g/dL   AST 133 (H) 15 - 41 U/L   ALT 86 (H) 17 - 63 U/L   Alkaline Phosphatase 166 (H) 38 - 126 U/L   Total Bilirubin 2.7 (H) 0.3 - 1.2 mg/dL   GFR calc non Af Amer >60 >60 mL/min   GFR calc Af Amer >60 >60 mL/min    Comment: (NOTE) The eGFR has been calculated using the CKD EPI equation. This calculation has not been validated in all clinical situations. eGFR's persistently <60 mL/min signify possible Chronic Kidney Disease.    Anion gap 10 5 - 15  Lipase, blood     Status: None   Collection Time: 02/24/17  1:38 PM  Result Value Ref Range   Lipase 44 11 - 51 U/L  Ammonia     Status: Abnormal   Collection Time: 02/24/17  4:05 PM  Result Value Ref Range   Ammonia 89 (H) 9 - 35 umol/L  Parathyroid hormone, intact (no Ca)     Status: Abnormal   Collection Time: 02/24/17  4:23 PM  Result Value Ref Range   PTH 13 (L) 15 - 65 pg/mL    Comment: (NOTE) Performed At: [facility] [place], [place] [id number] Lindon Romp MD [id number]   TSH     Status: Abnormal   Collection Time: 02/24/17  4:23 PM  Result Value Ref Range   TSH 10.518 (H) 0.350 - 4.500 uIU/mL    Comment: Performed by a 3rd Generation assay with a functional sensitivity of <=0.01 uIU/mL.  Phosphorus     Status: Abnormal   Collection Time: 02/24/17  4:23 PM  Result Value Ref Range   Phosphorus 2.3 (L) 2.5 - 4.6 mg/dL   Magnesium     Status: Abnormal   Collection Time: 02/24/17  4:23 PM  Result Value Ref Range   Magnesium 1.6 (L) 1.7 - 2.4 mg/dL  AFP tumor marker     Status: Abnormal  Collection Time: 02/24/17  4:23 PM  Result Value Ref Range   AFP-Tumor Marker 86,531.0 (H) 0.0 - 8.3 ng/mL    Comment: (NOTE) Results confirmed on dilution. Roche ECLIA methodology Performed At: [facility] [place], [place] [id number] Lindon Romp MD LK:[id number]   I-Stat Chem 8, ED     Status: Abnormal   Collection Time: 02/24/17  4:29 PM  Result Value Ref Range   Sodium 130 (L) 135 - 145 mmol/L   Potassium 4.4 3.5 - 5.1 mmol/L   Chloride 93 (L) 101 - 111 mmol/L   BUN 28 (H) 6 - 20 mg/dL   Creatinine, Ser 1.30 (H) 0.61 - 1.24 mg/dL   Glucose, Bld 85 65 - 99 mg/dL   Calcium, Ion 2.02 (HH) 1.15 - 1.40 mmol/L   TCO2 29 0 - 100 mmol/L   Hemoglobin 16.0 13.0 - 17.0 g/dL   HCT 47.0 39.0 - 52.0 %   Comment NOTIFIED PHYSICIAN   Urinalysis, Routine w reflex microscopic     Status: Abnormal   Collection Time: 02/24/17  8:44 PM  Result Value Ref Range   Color, Urine YELLOW YELLOW   APPearance CLEAR CLEAR   Specific Gravity, Urine 1.009 1.005 - 1.030   pH 6.0 5.0 - 8.0   Glucose, UA NEGATIVE NEGATIVE mg/dL   Hgb urine dipstick NEGATIVE NEGATIVE   Bilirubin Urine NEGATIVE NEGATIVE   Ketones, ur 20 (A) NEGATIVE mg/dL   Protein, ur NEGATIVE NEGATIVE mg/dL   Nitrite NEGATIVE NEGATIVE   Leukocytes, UA NEGATIVE NEGATIVE  Protime-INR     Status: Abnormal   Collection Time: 02/24/17  8:50 PM  Result Value Ref Range   Prothrombin Time 15.8 (H) 11.4 - 15.2 seconds   INR 0.96   Basic metabolic panel     Status: Abnormal   Collection Time: 02/24/17  8:50 PM  Result Value Ref Range   Sodium 130 (L) 135 - 145 mmol/L   Potassium 4.0 3.5 - 5.1 mmol/L   Chloride 95 (L) 101 - 111 mmol/L   CO2 23 22 - 32 mmol/L   Glucose, Bld 88 65 - 99 mg/dL   BUN 22 (H) 6 - 20 mg/dL   Creatinine, Ser 1.09  0.61 - 1.24 mg/dL   Calcium 13.8 (HH) 8.9 - 10.3 mg/dL    Comment: CRITICAL RESULT CALLED TO, READ BACK BY AND VERIFIED WITH: COLUMBRES,S RN 02/24/2017 2248 JORDANS    GFR calc non Af Amer >60 >60 mL/min   GFR calc Af Amer >60 >60 mL/min    Comment: (NOTE) The eGFR has been calculated using the CKD EPI equation. This calculation has not been validated in all clinical situations. eGFR's persistently <60 mL/min signify possible Chronic Kidney Disease.    Anion gap 12 5 - 15  Comprehensive metabolic panel     Status: Abnormal   Collection Time: 02/25/17  3:18 AM  Result Value Ref Range   Sodium 133 (L) 135 - 145 mmol/L   Potassium 4.0 3.5 - 5.1 mmol/L   Chloride 100 (L) 101 - 111 mmol/L   CO2 24 22 - 32 mmol/L   Glucose, Bld 84 65 - 99 mg/dL   BUN 15 6 - 20 mg/dL   Creatinine, Ser 0.97 0.61 - 1.24 mg/dL   Calcium 12.6 (H) 8.9 - 10.3 mg/dL   Total Protein 6.6 6.5 - 8.1 g/dL   Albumin 2.5 (L) 3.5 - 5.0 g/dL   AST 104 (H) 15 - 41 U/L   ALT  66 (H) 17 - 63 U/L   Alkaline Phosphatase 131 (H) 38 - 126 U/L   Total Bilirubin 2.7 (H) 0.3 - 1.2 mg/dL   GFR calc non Af Amer >60 >60 mL/min   GFR calc Af Amer >60 >60 mL/min    Comment: (NOTE) The eGFR has been calculated using the CKD EPI equation. This calculation has not been validated in all clinical situations. eGFR's persistently <60 mL/min signify possible Chronic Kidney Disease.    Anion gap 9 5 - 15  CBC     Status: Abnormal   Collection Time: 02/25/17  3:18 AM  Result Value Ref Range   WBC 8.1 4.0 - 10.5 K/uL   RBC 3.79 (L) 4.22 - 5.81 MIL/uL   Hemoglobin 12.3 (L) 13.0 - 17.0 g/dL    Comment: REPEATED TO VERIFY DELTA CHECK NOTED    HCT 37.3 (L) 39.0 - 52.0 %   MCV 98.4 78.0 - 100.0 fL   MCH 32.5 26.0 - 34.0 pg   MCHC 33.0 30.0 - 36.0 g/dL   RDW 18.0 (H) 11.5 - 15.5 %   Platelets 212 150 - 400 K/uL  Protime-INR     Status: Abnormal   Collection Time: 02/25/17  3:18 AM  Result Value Ref Range   Prothrombin Time 15.9  (H) 11.4 - 15.2 seconds   INR 1.26     Imaging: No results found.  Assessment/Plan 1. Hepatic lesions  We can plan to attempt liver biopsy at some point this admission.  It is possible that we can try for Monday, but pending scheduling, it could be Tuesday.  I have tried to contact his mother on her home and cell phone.  She did not answer so as of now, we do not have consent.  We will try to call her again some other time.  I will make him ready for Monday so if we can proceed, then we will.  Thank you for this interesting consult.  I greatly enjoyed meeting ISAIA HASSELL and look forward to participating in their care.  A copy of this report was sent to the requesting provider on this date.  Electronically Signed: Henreitta Cea 02/25/2017, 9:50 AM   I spent a total of 40 Minutes    in face to face in clinical consultation, greater than 50% of which was counseling/coordinating care for liver lesions

## 2017-02-25 NOTE — Progress Notes (Signed)
Initial Nutrition Assessment  DOCUMENTATION CODES:   Severe malnutrition in context of chronic illness  INTERVENTION:  1. Continue Ensure Enlive po TID, each supplement provides 350 kcal and 20 grams of protein 2. Continue Thiamine, Folic Acid, MVI  NUTRITION DIAGNOSIS:   Malnutrition related to chronic illness as evidenced by severe depletion of body fat, severe depletion of muscle mass.  GOAL:   Patient will meet greater than or equal to 90% of their needs  MONITOR:   PO intake, I & O's, Labs, Weight trends, Supplement acceptance  REASON FOR ASSESSMENT:   Malnutrition Screening Tool    ASSESSMENT:   Dennis Bell  is a 56 y.o. male, with hx of ?Hep C, tobacco abuse, PTSD, Who was brought to the ED by his mother for worsening abdominal pain, generalized weakness, poor by mouth intake and significant weight loss.  Spoke with patient briefly but he is significantly confused & agitated. Actively states he wants to go home [place] in ED, no history available. Found with hepatic lesions, hepatocellular cancer w/  Bony mets, failure to thrive Nutrition-Focused physical exam completed. Findings are severe fat depletion, severe muscle depletion, and no edema.  Labs and medications reviewed: Na 133, AST 104, ALT 66, TBili 2.7 NS @ 19mL/hr  Diet Order:  Diet NPO time specified Except for: Sips with Meds Diet regular Room service appropriate? Yes; Fluid consistency: Thin  Skin:  Reviewed, no issues  Last BM:  PTA  Height:   Ht Readings from Last 1 Encounters:  02/24/17 6' (1.829 m)    Weight:   Wt Readings from Last 1 Encounters:  02/24/17 190 lb (86.2 kg)    Ideal Body Weight:  80.9 kg  BMI:  Body mass index is 25.77 kg/m.  Estimated Nutritional Needs:   Kcal:  9449-6759 calories (30-33 cal/kg)  Protein:  112-130 grams (1.3-1.5 grams/kg)  Fluid:  >/= 2.6L  EDUCATION NEEDS:   No education needs identified at this time  Satira Anis. Tongela Encinas, MS, RD  LDN Inpatient Clinical Dietitian Pager [phone]

## 2017-02-26 ENCOUNTER — Inpatient Hospital Stay (HOSPITAL_COMMUNITY): Payer: Medicare Other

## 2017-02-26 DIAGNOSIS — F0631 Mood disorder due to known physiological condition with depressive features: Secondary | ICD-10-CM

## 2017-02-26 DIAGNOSIS — R109 Unspecified abdominal pain: Secondary | ICD-10-CM

## 2017-02-26 DIAGNOSIS — F17210 Nicotine dependence, cigarettes, uncomplicated: Secondary | ICD-10-CM

## 2017-02-26 DIAGNOSIS — F1290 Cannabis use, unspecified, uncomplicated: Secondary | ICD-10-CM

## 2017-02-26 IMAGING — CT CT PELVIS W/ CM
2 of 3 series · 15 of 46 positions shown, 17 images · IV contrast (iopamidol)
Comparison: CT 01/10/2017 and 01/24/2017.  MRI 02/25/2017.

CLINICAL DATA: Cirrhosis with HCC versus hepatic metastases seen on
prior CT. Pelvic mass seen on prior MRI concerning for metastatic
disease.

EXAM:
CT PELVIS WITH CONTRAST
TECHNIQUE: Multidetector CT imaging of the pelvis was performed using the
standard protocol following the bolus administration of intravenous
contrast.
CONTRAST:  100mL [id number] IOPAMIDOL ([id number]) INJECTION 61%

[Series 3: abd/ pelvis 5.0 i30f 2 · axial · 0.88mm/px · z∈[+833,+1098]mm · 12 of 61 slices shown, 14 images]
[im 4/61  soft-tissue]
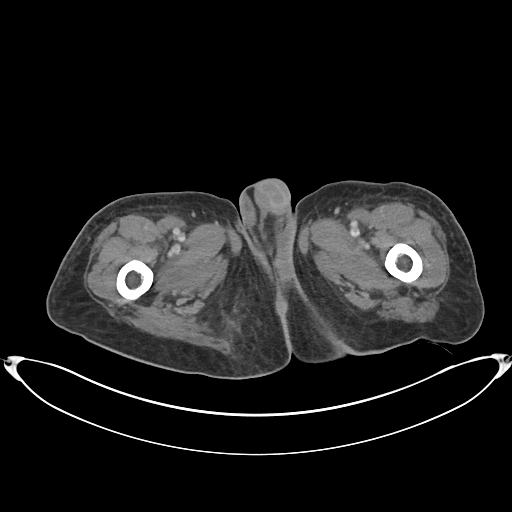
[im 4/61  bone]
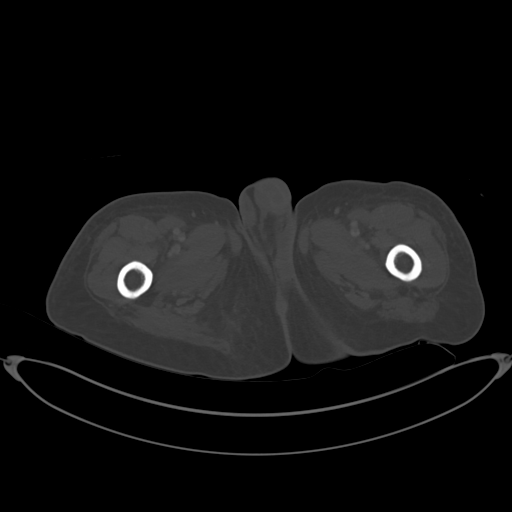
[im 8/61  soft-tissue]
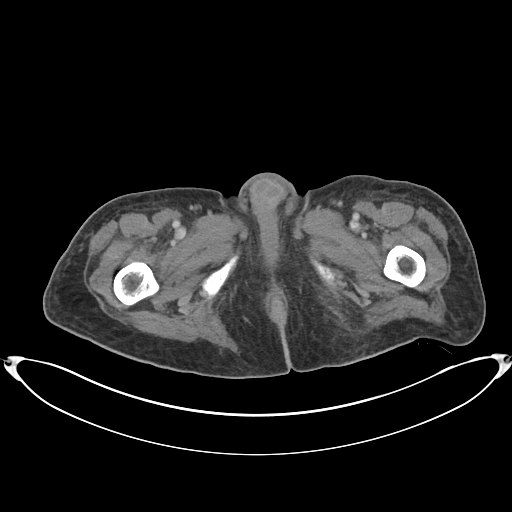
[im 14/61  soft-tissue]
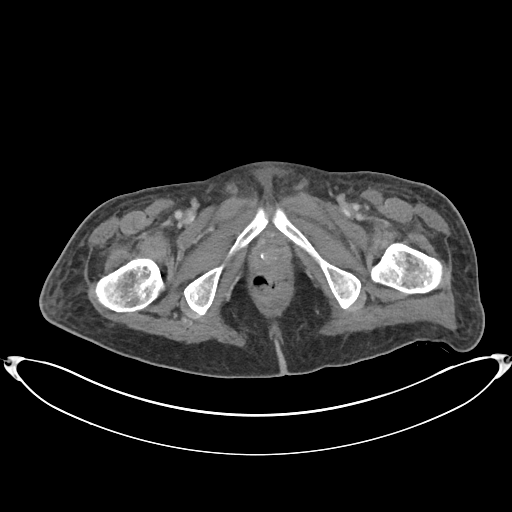
[im 18/61  soft-tissue]
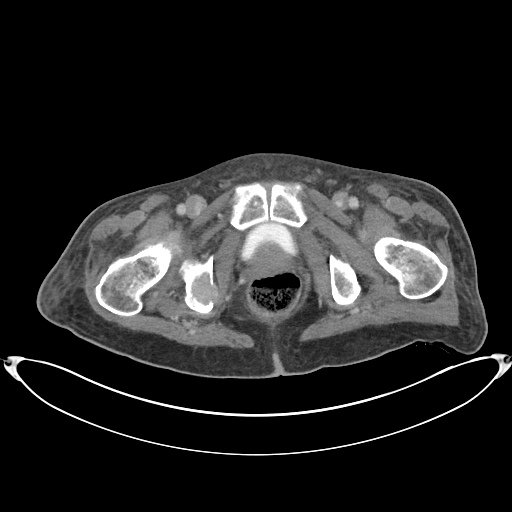
[im 24/61  soft-tissue]
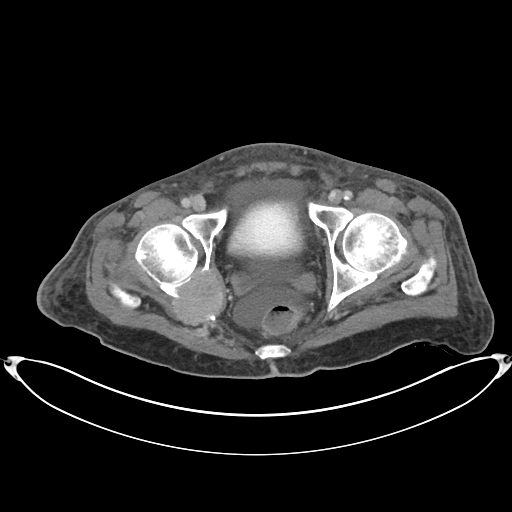
[im 28/61  soft-tissue]
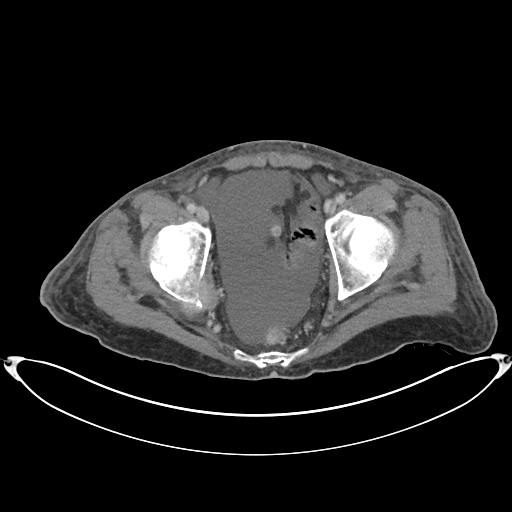
[im 33/61  soft-tissue]
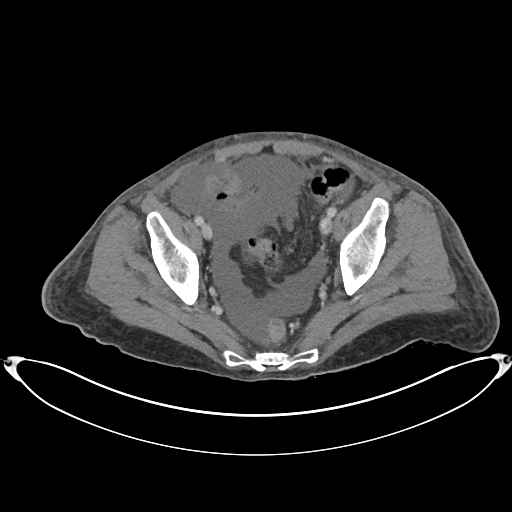
[im 37/61  soft-tissue]
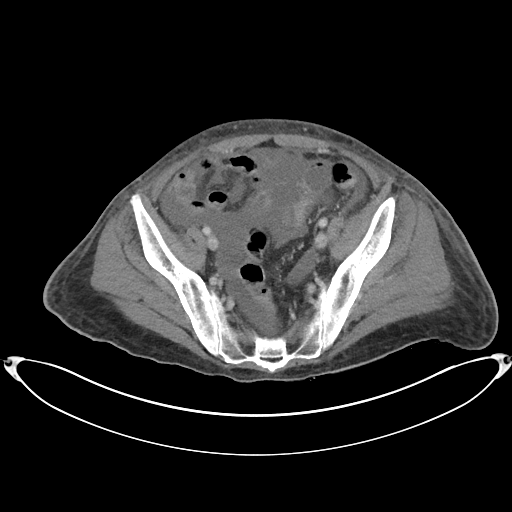
[im 43/61  soft-tissue]
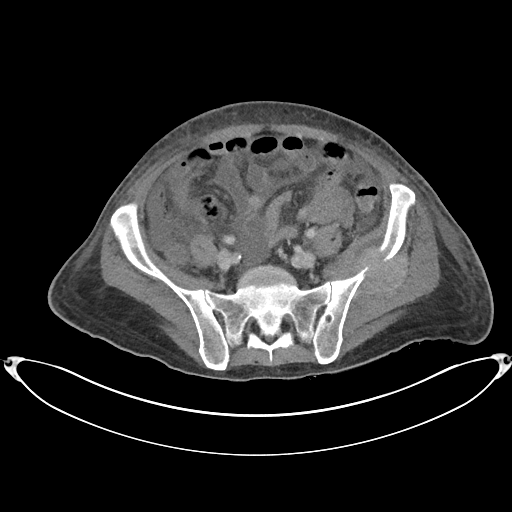
[im 43/61  bone]
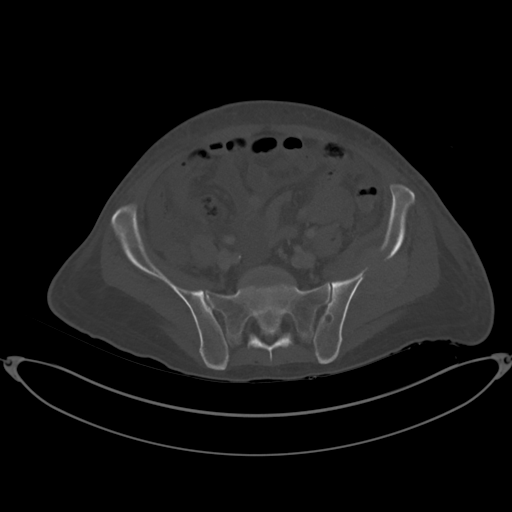
[im 47/61  soft-tissue]
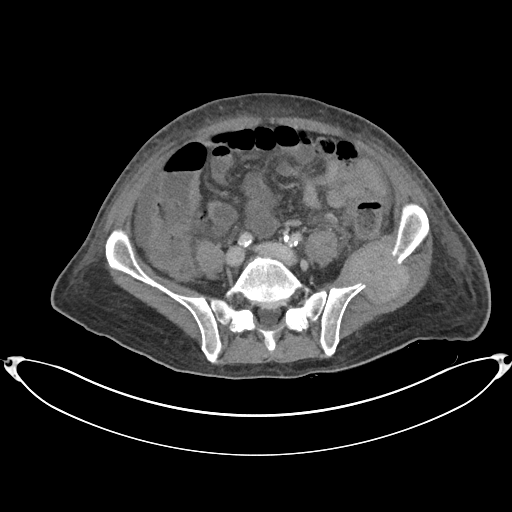
[im 53/61  soft-tissue]
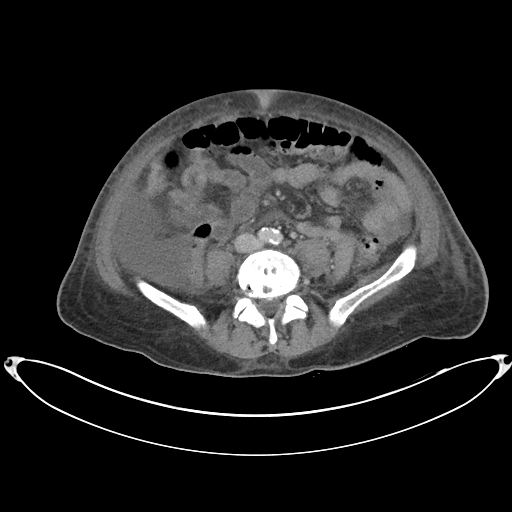
[im 57/61  soft-tissue]
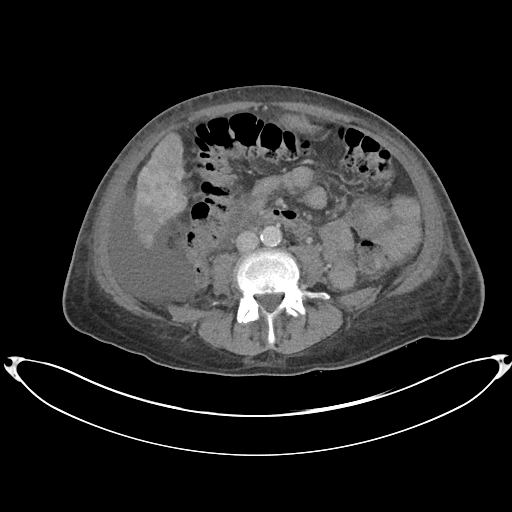

[Series 6: coronal soft tissue · coronal · 0.59mm/px · 3 of 100 slices shown]
[im 34/100  soft-tissue]
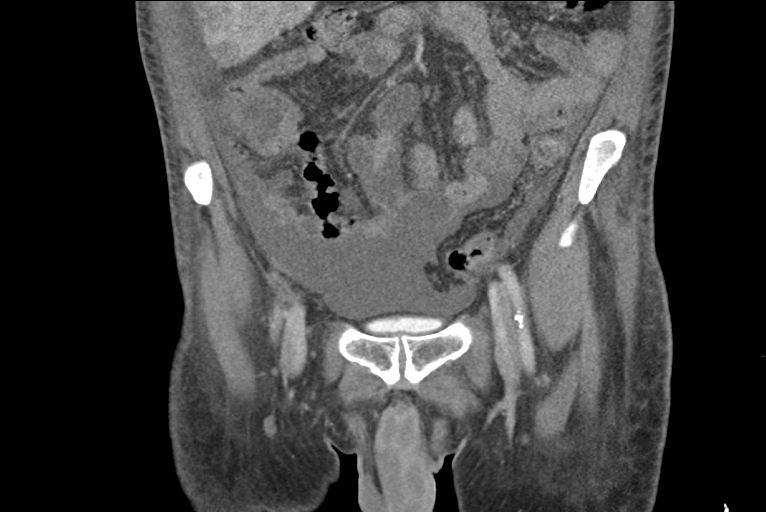
[im 45/100  soft-tissue]
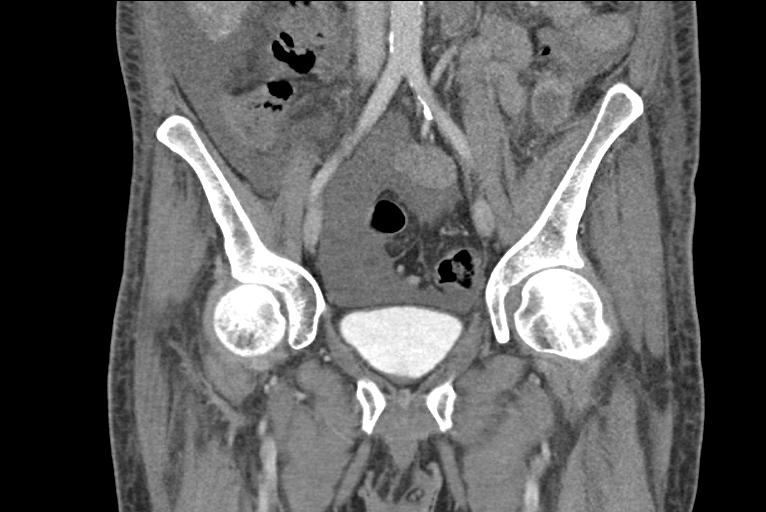
[im 56/100  soft-tissue]
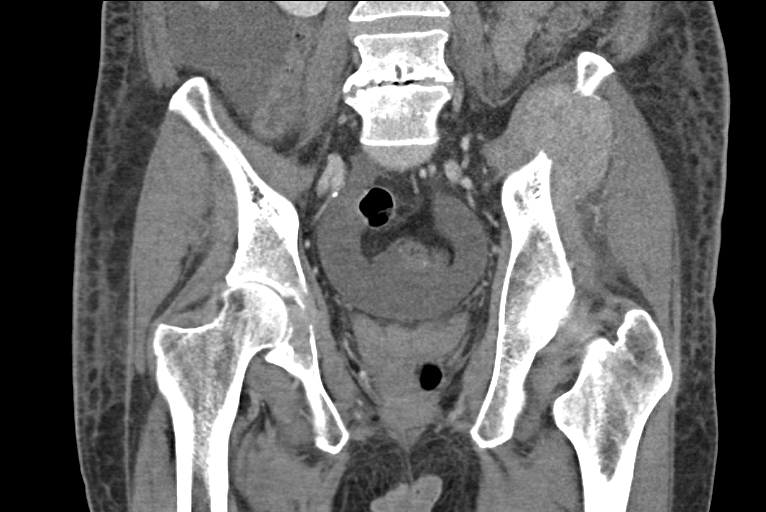

[15 of 46 positions shown; findings below may reference images not displayed]

FINDINGS: Urinary Tract: Ureters are decompressed. Urinary bladder
unremarkable.

Bowel: Sigmoid diverticulosis. Pelvic small bowel decompressed and
unremarkable.

Vascular/Lymphatic: Aortic and iliac calcifications. No aneurysm. No
adenopathy in the lower retroperitoneum or pelvis.

Reproductive:  No visible focal abnormality.

Other: No mass seen in the central pelvis as seen on prior MRI.
Moderate to large volume free fluid in the pelvis.

Musculoskeletal: Enlarging soft tissue ********* sec enlarging areas
of bony destruction and soft tissue masses involving the posterior
right acetabulum measures up to 4.7 cm compared to 3.1 cm on prior
CT. Enlarging destructive mass with soft tissue component in the
left iliac bone measuring up to 5.3 cm compared to 2.8 cm
previously.
IMPRESSION: No central pelvic mass noted on today's CT as suggested on prior
MRI. There is large volume free fluid in the pelvis.

Enlarging soft tissue masses and bony destruction in the left iliac
bone and right posterior acetabulum. This is compatible with
worsening metastatic disease.

Sigmoid diverticulosis.

Aortoiliac atherosclerosis.

## 2017-02-26 MED ORDER — IBUPROFEN 400 MG PO TABS
400.0000 mg | ORAL_TABLET | ORAL | Status: DC | PRN
  Administered 2017-02-27: 400 mg via ORAL
  Filled 2017-02-26: qty 1

## 2017-02-26 MED ORDER — IOPAMIDOL (ISOVUE-300) INJECTION 61%
INTRAVENOUS | Status: AC
  Administered 2017-02-26: 100 mL via INTRAVENOUS
  Filled 2017-02-26: qty 100

## 2017-02-26 MED ORDER — ACETAMINOPHEN 325 MG PO TABS
650.0000 mg | ORAL_TABLET | Freq: Four times a day (QID) | ORAL | Status: DC | PRN
  Administered 2017-02-26: 650 mg via ORAL
  Filled 2017-02-26: qty 2

## 2017-02-26 NOTE — Progress Notes (Signed)
[facility] Progress Note  Dennis Bell 56 y.o. 16-Apr-1961  CC:  Metastatic adenocarcinoma with possible HCC   Subjective: Patient trying to leave the hospital this morning. Multiple security officers in the patient's room. Discussed with the patient regarding need for ongoing hospitalization and future workup such as liver biopsy. Patient insisted on leaving the hospital.   ROS : Not able to obtain   Objective: Vital signs in last 24 hours: Vitals:   02/26/17 0057 02/26/17 0630  BP:  117/77  Pulse:  (!) 115  Resp:  20  Temp: 98.7 F (37.1 C) 97.7 F (36.5 C)    Physical Exam: Patient appears to be agitated and trying to leave the hospital. Physical examination was not performed today   Lab Results:  Recent Labs  02/24/17 1623  02/24/17 2050 02/25/17 0318  NA  --   < > 130* 133*  K  --   < > 4.0 4.0  CL  --   < > 95* 100*  CO2  --   --  23 24  GLUCOSE  --   < > 88 84  BUN  --   < > 22* 15  CREATININE  --   < > 1.09 0.97  CALCIUM  --   --  13.8* 12.6*  MG 1.6*  --   --   --   PHOS 2.3*  --   --   --   < > = values in this interval not displayed.  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 Patient with elevated alpha-fetoprotein of 86,000 - Abnormal LFTs. Most likely from underlying diffuse hepatic metastasis - Chronic  hepatitis C. Genotype 1. Treatment nave. - Diffuse abdominal pain. -  Agitation.   Recommendations ------------------------ - Follow MRI-MRCP report. - Patient is currently being followed by interventional radiology. Possible liver biopsy in next few days - GI will follow    Otis Brace MD, FACP 02/26/2017, 6:56 AM  Pager [phone]  If no answer or after 5 PM call [phone]

## 2017-02-26 NOTE — Progress Notes (Signed)
Tried to contact patient's mother again via her home and cell phone today.  I am still unable to reach her for consent for a liver biopsy.  Will continue to try and contact her for consent.  Shley Dolby E 10:28 AM 02/26/2017

## 2017-02-26 NOTE — Consult Note (Signed)
[facility] Face-to-Face Psychiatry Consult   Reason for Consult:  Agitation, violent, confused Referring Physician:  Dr. Sheran Fava Patient Identification: Dennis Bell MRN:  [id number] Principal Diagnosis: Abdominal pain Diagnosis:   Patient Active Problem List   Diagnosis Date Noted  . Hypercalcemia [E83.52] 02/24/2017  . Hepatocellular carcinoma ([place]) [C22.0] 02/24/2017  . Failure to thrive in adult [R62.7] 02/24/2017  . Abdominal pain [R10.9] 02/24/2017  . Hyponatremia [E87.1] 02/24/2017  . Protein-calorie malnutrition, severe ([place]) [E43] 02/24/2017  . AKI (acute kidney injury) ([place]) [N17.9] 02/24/2017    Total Time spent with patient: 1 hour  Subjective:   Dennis Bell is a 56 y.o. male patient admitted with abdominal pain.  HPI:   Thank you for asking me to do evaluate patient for Capacity to make decisions. Patient is a 56 year old male who reports history of Hepatitis C, Nicotine dependence , PTSD for which he is not currently taking any medications. Patient reports that he was brought to the hospital for the treatment of severe abdominal pain, generalized weakness, weight loss and inability to eat. Today, he is calm, agitated, cooperative and reports that he is compliant with all treatment offered to him except blood drawn. However, he states that he will allow his blood to be drawn for testing if that will facility his discharge from the hospital. Patient states that he is aware of his medical issues and the need for him to cooperate with treatment. Today, he reports mild depression due to his medical issues but denies psychosis, SI/HI or delusional thinking.    Past Psychiatric History: PTSD  Risk to Self: Is patient at risk for suicide?: No Risk to Others:   Prior Inpatient Therapy:   Prior Outpatient Therapy:    Past Medical History:  Past Medical History:  Diagnosis Date  . Chronic back pain   . Hepatitis C   . PTSD (post-traumatic stress disorder)     Past  Surgical History:  Procedure Laterality Date  . TONSILLECTOMY     Family History:  Family History  Problem Relation Age of Onset  . Hypertension Mother    Family Psychiatric  History:  Social History:  History  Alcohol Use No     History  Drug Use  . Types: Marijuana    Social History   Social History  . Marital status: Single    Spouse name: N/A  . Number of children: N/A  . Years of education: N/A   Social History Main Topics  . Smoking status: Current Every Day Smoker    Types: Cigarettes  . Smokeless tobacco: Never Used  . Alcohol use No  . Drug use: Yes    Types: Marijuana  . Sexual activity: Not Asked   Other Topics Concern  . None   Social History Narrative  . None   Additional Social History:    Allergies:   Allergies  Allergen Reactions  . Codeine Other (See Comments)    "screws me up"  . Penicillins Nausea And Vomiting    Has patient had a PCN reaction causing immediate rash, facial/tongue/throat swelling, SOB or lightheadedness with hypotension: No Has patient had a PCN reaction causing severe rash involving mucus membranes or skin necrosis: No Has patient had a PCN reaction that required hospitalization: No Has patient had a PCN reaction occurring within the last 10 years: Yes If all of the above answers are "NO", then may proceed with Cephalosporin use.     Labs:  Results for orders placed or performed during  the hospital encounter of 02/24/17 (from the past 48 hour(s))  Ammonia     Status: Abnormal   Collection Time: 02/24/17  4:05 PM  Result Value Ref Range   Ammonia 89 (H) 9 - 35 umol/L  Parathyroid hormone, intact (no Ca)     Status: Abnormal   Collection Time: 02/24/17  4:23 PM  Result Value Ref Range   PTH 13 (L) 15 - 65 pg/mL    Comment: (NOTE) Performed At: [facility] [place], [place] [id number] Lindon Romp MD [id number]   TSH     Status: Abnormal   Collection Time: 02/24/17  4:23 PM   Result Value Ref Range   TSH 10.518 (H) 0.350 - 4.500 uIU/mL    Comment: Performed by a 3rd Generation assay with a functional sensitivity of <=0.01 uIU/mL.  Phosphorus     Status: Abnormal   Collection Time: 02/24/17  4:23 PM  Result Value Ref Range   Phosphorus 2.3 (L) 2.5 - 4.6 mg/dL  Magnesium     Status: Abnormal   Collection Time: 02/24/17  4:23 PM  Result Value Ref Range   Magnesium 1.6 (L) 1.7 - 2.4 mg/dL  AFP tumor marker     Status: Abnormal   Collection Time: 02/24/17  4:23 PM  Result Value Ref Range   AFP-Tumor Marker 86,531.0 (H) 0.0 - 8.3 ng/mL    Comment: (NOTE) Results confirmed on dilution. Roche ECLIA methodology Performed At: [facility] [place], [place] [id number] Lindon Romp MD [id number]   I-Stat Chem 8, ED     Status: Abnormal   Collection Time: 02/24/17  4:29 PM  Result Value Ref Range   Sodium 130 (L) 135 - 145 mmol/L   Potassium 4.4 3.5 - 5.1 mmol/L   Chloride 93 (L) 101 - 111 mmol/L   BUN 28 (H) 6 - 20 mg/dL   Creatinine, Ser 1.30 (H) 0.61 - 1.24 mg/dL   Glucose, Bld 85 65 - 99 mg/dL   Calcium, Ion 2.02 (HH) 1.15 - 1.40 mmol/L   TCO2 29 0 - 100 mmol/L   Hemoglobin 16.0 13.0 - 17.0 g/dL   HCT 47.0 39.0 - 52.0 %   Comment NOTIFIED PHYSICIAN   Urinalysis, Routine w reflex microscopic     Status: Abnormal   Collection Time: 02/24/17  8:44 PM  Result Value Ref Range   Color, Urine YELLOW YELLOW   APPearance CLEAR CLEAR   Specific Gravity, Urine 1.009 1.005 - 1.030   pH 6.0 5.0 - 8.0   Glucose, UA NEGATIVE NEGATIVE mg/dL   Hgb urine dipstick NEGATIVE NEGATIVE   Bilirubin Urine NEGATIVE NEGATIVE   Ketones, ur 20 (A) NEGATIVE mg/dL   Protein, ur NEGATIVE NEGATIVE mg/dL   Nitrite NEGATIVE NEGATIVE   Leukocytes, UA NEGATIVE NEGATIVE  Protime-INR     Status: Abnormal   Collection Time: 02/24/17  8:50 PM  Result Value Ref Range   Prothrombin Time 15.8 (H) 11.4 - 15.2 seconds   INR 1.26   HIV antibody (Routine  Testing)     Status: None   Collection Time: 02/24/17  8:50 PM  Result Value Ref Range   HIV Screen 4th Generation wRfx Non Reactive Non Reactive    Comment: (NOTE) Performed At: [facility] [place], [place] [id number] Lindon Romp MD AY:[id number]   Basic metabolic panel     Status: Abnormal   Collection Time: 02/24/17  8:50 PM  Result Value Ref Range  Sodium 130 (L) 135 - 145 mmol/L   Potassium 4.0 3.5 - 5.1 mmol/L   Chloride 95 (L) 101 - 111 mmol/L   CO2 23 22 - 32 mmol/L   Glucose, Bld 88 65 - 99 mg/dL   BUN 22 (H) 6 - 20 mg/dL   Creatinine, Ser 1.09 0.61 - 1.24 mg/dL   Calcium 13.8 (HH) 8.9 - 10.3 mg/dL    Comment: CRITICAL RESULT CALLED TO, READ BACK BY AND VERIFIED WITH: COLUMBRES,S RN 02/24/2017 2248 JORDANS    GFR calc non Af Amer >60 >60 mL/min   GFR calc Af Amer >60 >60 mL/min    Comment: (NOTE) The eGFR has been calculated using the CKD EPI equation. This calculation has not been validated in all clinical situations. eGFR's persistently <60 mL/min signify possible Chronic Kidney Disease.    Anion gap 12 5 - 15  Comprehensive metabolic panel     Status: Abnormal   Collection Time: 02/25/17  3:18 AM  Result Value Ref Range   Sodium 133 (L) 135 - 145 mmol/L   Potassium 4.0 3.5 - 5.1 mmol/L   Chloride 100 (L) 101 - 111 mmol/L   CO2 24 22 - 32 mmol/L   Glucose, Bld 84 65 - 99 mg/dL   BUN 15 6 - 20 mg/dL   Creatinine, Ser 0.97 0.61 - 1.24 mg/dL   Calcium 12.6 (H) 8.9 - 10.3 mg/dL   Total Protein 6.6 6.5 - 8.1 g/dL   Albumin 2.5 (L) 3.5 - 5.0 g/dL   AST 104 (H) 15 - 41 U/L   ALT 66 (H) 17 - 63 U/L   Alkaline Phosphatase 131 (H) 38 - 126 U/L   Total Bilirubin 2.7 (H) 0.3 - 1.2 mg/dL   GFR calc non Af Amer >60 >60 mL/min   GFR calc Af Amer >60 >60 mL/min    Comment: (NOTE) The eGFR has been calculated using the CKD EPI equation. This calculation has not been validated in all clinical situations. eGFR's persistently <60 mL/min  signify possible Chronic Kidney Disease.    Anion gap 9 5 - 15  CBC     Status: Abnormal   Collection Time: 02/25/17  3:18 AM  Result Value Ref Range   WBC 8.1 4.0 - 10.5 K/uL   RBC 3.79 (L) 4.22 - 5.81 MIL/uL   Hemoglobin 12.3 (L) 13.0 - 17.0 g/dL    Comment: REPEATED TO VERIFY DELTA CHECK NOTED    HCT 37.3 (L) 39.0 - 52.0 %   MCV 98.4 78.0 - 100.0 fL   MCH 32.5 26.0 - 34.0 pg   MCHC 33.0 30.0 - 36.0 g/dL   RDW 18.0 (H) 11.5 - 15.5 %   Platelets 212 150 - 400 K/uL  Protime-INR     Status: Abnormal   Collection Time: 02/25/17  3:18 AM  Result Value Ref Range   Prothrombin Time 15.9 (H) 11.4 - 15.2 seconds   INR 1.26     Current Facility-Administered Medications  Medication Dose Route Frequency Provider Last Rate Last Dose  . feeding supplement (ENSURE ENLIVE) (ENSURE ENLIVE) liquid 237 mL  237 mL Oral TID BM Janece Canterbury, MD   237 mL at 02/26/17 1016  . folic acid (FOLVITE) tablet 1 mg  1 mg Oral Daily Short, Mackenzie, MD      . haloperidol lactate (HALDOL) injection 2 mg  2 mg Intravenous Q6H PRN Dhungel, Nishant, MD   2 mg at 02/26/17 0627  . lactulose (CHRONULAC) 10 GM/15ML solution 10  g  10 g Oral BID Janece Canterbury, MD   10 g at 02/26/17 1016  . LORazepam (ATIVAN) injection 0-4 mg  0-4 mg Intravenous Q6H Janece Canterbury, MD       Followed by  . [START ON 02/27/2017] LORazepam (ATIVAN) injection 0-4 mg  0-4 mg Intravenous Q12H Short, Mackenzie, MD      . LORazepam (ATIVAN) tablet 1 mg  1 mg Oral Q6H PRN Janece Canterbury, MD       Or  . LORazepam (ATIVAN) injection 1 mg  1 mg Intravenous Q6H PRN Short, Mackenzie, MD      . MEDLINE mouth rinse  15 mL Mouth Rinse BID Dhungel, Nishant, MD   15 mL at 02/26/17 1019  . morphine 2 MG/ML injection 2 mg  2 mg Intravenous Q3H PRN Dhungel, Nishant, MD   2 mg at 02/25/17 0352  . morphine 4 MG/ML injection 4 mg  4 mg Intravenous Once Deno Etienne, DO   Stopped at 02/24/17 1634  . multivitamin with minerals tablet 1 tablet  1 tablet  Oral Daily Short, Mackenzie, MD      . nicotine (NICODERM CQ - dosed in mg/24 hours) patch 21 mg  21 mg Transdermal Daily Deno Etienne, DO   21 mg at 02/26/17 1018  . ondansetron (ZOFRAN) injection 4 mg  4 mg Intravenous Once Deno Etienne, DO   Stopped at 02/24/17 1634  . pneumococcal 23 valent vaccine (PNU-IMMUNE) injection 0.5 mL  0.5 mL Intramuscular Once Short, Noah Delaine, MD      . promethazine (PHENERGAN) tablet 12.5 mg  12.5 mg Oral Q6H PRN Dhungel, Nishant, MD      . sodium chloride flush (NS) 0.9 % injection 3 mL  3 mL Intravenous Q12H Dhungel, Nishant, MD   3 mL at 02/25/17 2103  . thiamine (VITAMIN B-1) tablet 100 mg  100 mg Oral Daily Short, Mackenzie, MD       Or  . thiamine (B-1) injection 100 mg  100 mg Intravenous Daily Short, Mackenzie, MD        Musculoskeletal: Strength & Muscle Tone: within normal limits Gait & Station: not tested Patient leans: N/A  Psychiatric Specialty Exam: Physical Exam  Psychiatric: His behavior is normal. Judgment and thought content normal. His affect is blunt. His speech is delayed. Cognition and memory are normal.    Review of Systems  Constitutional: Positive for malaise/fatigue.  HENT: Negative.   Cardiovascular: Negative.   Gastrointestinal: Positive for abdominal pain.  Skin: Negative.   Neurological: Negative.   Psychiatric/Behavioral: Negative.     Blood pressure 128/81, pulse 91, temperature 97.7 F (36.5 C), resp. rate 20, height 6' (1.829 m), weight 86.2 kg (190 lb), SpO2 98 %.Body mass index is 25.77 kg/m.  General Appearance: Casual  Eye Contact:  Good  Speech:  Clear and Coherent  Volume:  Increased  Mood:  Dysphoric  Affect:  Appropriate  Thought Process:  Coherent  Orientation:  Full (Time, Place, and Person)  Thought Content:  Logical  Suicidal Thoughts:  No  Homicidal Thoughts:  No  Memory:  Immediate;   Fair Recent;   Fair Remote;   Fair  Judgement:  Other:  marginal  Insight:  marginal  Psychomotor Activity:   Psychomotor Retardation  Concentration:  Concentration: Fair and Attention Span: Fair  Recall:  [employer] of Knowledge:  Fair  Language:  Good  Akathisia:  No  Handed:  Right  AIMS (if indicated):     Assets:  Communication Skills  Desire for Improvement  ADL's:  undetermined  Cognition:  WNL MMSE: 25/30  Sleep:   Fair     Treatment Plan Summary: 56 year old man with history of PTSD who was referred to psychiatric service for capacity determination due to agitation, violence, confusion prior to today. Patient is alert and oriented today and states that he is willing to cooperative to get the care that he needs without putting up any resistance. Patient states he is aware of his medical issues and demonstrate understanding of the need to get treatment. Based on patient presentation today, he has capacity to make decisions. However, this may change tomorrow as capacity to make decision change from time to time.  Recommendation: -Unit  social worker to assist with contacting  patient's family to apply for health care power of attorney.    Disposition:  -Re-consult psychiatric service if necessary.  Corena Pilgrim, MD 02/26/2017 2:40 PM
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
## 2017-02-27 DIAGNOSIS — R1031 Right lower quadrant pain: Secondary | ICD-10-CM

## 2017-02-27 DIAGNOSIS — Z66 Do not resuscitate: Secondary | ICD-10-CM

## 2017-02-27 DIAGNOSIS — C7951 Secondary malignant neoplasm of bone: Secondary | ICD-10-CM

## 2017-02-27 DIAGNOSIS — Z515 Encounter for palliative care: Secondary | ICD-10-CM

## 2017-02-27 DIAGNOSIS — C220 Liver cell carcinoma: Secondary | ICD-10-CM

## 2017-02-27 LAB — COMPREHENSIVE METABOLIC PANEL
ALT: 68 U/L — AB (ref 17–63)
AST: 99 U/L — AB (ref 15–41)
Albumin: 2.5 g/dL — ABNORMAL LOW (ref 3.5–5.0)
Alkaline Phosphatase: 128 U/L — ABNORMAL HIGH (ref 38–126)
Anion gap: 15 (ref 5–15)
BILIRUBIN TOTAL: 2.5 mg/dL — AB (ref 0.3–1.2)
BUN: 16 mg/dL (ref 6–20)
CO2: 16 mmol/L — ABNORMAL LOW (ref 22–32)
CREATININE: 0.97 mg/dL (ref 0.61–1.24)
Calcium: 10.3 mg/dL (ref 8.9–10.3)
Chloride: 102 mmol/L (ref 101–111)
GFR calc Af Amer: >60 mL/min (ref >60)
Glucose, Bld: 67 mg/dL (ref 65–99)
POTASSIUM: 3.6 mmol/L (ref 3.5–5.1)
Sodium: 133 mmol/L — ABNORMAL LOW (ref 135–145)
TOTAL PROTEIN: 6.4 g/dL — AB (ref 6.5–8.1)

## 2017-02-27 LAB — CBC
HCT: 36.7 % — ABNORMAL LOW (ref 39.0–52.0)
Hemoglobin: 12.3 g/dL — ABNORMAL LOW (ref 13.0–17.0)
MCH: 32.7 pg (ref 26.0–34.0)
MCHC: 33.5 g/dL (ref 30.0–36.0)
MCV: 97.6 fL (ref 78.0–100.0)
PLATELETS: 163 10*3/uL (ref 150–400)
RBC: 3.76 MIL/uL — ABNORMAL LOW (ref 4.22–5.81)
RDW: 18.1 % — AB (ref 11.5–15.5)
WBC: 9.7 10*3/uL (ref 4.0–10.5)

## 2017-02-27 LAB — MAGNESIUM: Magnesium: 1.2 mg/dL — ABNORMAL LOW (ref 1.7–2.4)

## 2017-02-27 MED ORDER — LACTULOSE 10 GM/15ML PO SOLN: 10.0000 g | Freq: Every day | ORAL | Status: DC

## 2017-02-27 MED ORDER — QUETIAPINE FUMARATE 25 MG PO TABS
25.0000 mg | ORAL_TABLET | Freq: Every day | ORAL | Status: DC
  Administered 2017-02-27: 25 mg via ORAL
  Filled 2017-02-27: qty 1

## 2017-02-27 NOTE — Progress Notes (Signed)
PROGRESS NOTE  RAJESH WYSS  ZOX:[id number] DOB: [birth year] DOA: 02/24/2017 PCP: Patient, No Pcp Per  Brief Narrative:   The patient is a 56 year old male with history of hepatitis C, tobacco abuse, PTSD who is brought to the emergency department by his mother for abdominal pain, generalized weakness, weight loss, and anorexia.  He was seen at [facility] on 12/28/2016 for abdominal pain and found to have hepatitis C, hypercalcemia, transaminitis. He had a CT of the abdomen and pelvis done on 5/15 which demonstrated hepatomegaly with diffuse hepatic metastasis versus multifocal hepatocellular carcinoma with cirrhosis. He had lytic lesions of the left iliac crest, right acetabulum. He has had abdominal adenopathy and a nonspecific right lung base nodule. 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 He did not follow-up for liver biopsy.  In the emergency department, he had a mild tachycardia and tachypnea. His sodium was 126 and his calcium was greater than 15. He continues to have a transaminitis with a total bilirubin of 2.7. He was given IV morphine, IV fluids, calcitonin, Ativan.  His calcium has trended down to 12.6. Patient had to be involuntarily committed secondary to agitation, confusion, attempting to leave.  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  Due to his severe malnutrition and frailty, I do not think that he would be a good candidate for traditional chemotherapy. He is also homeless, poorly compliant, and does not have good family support.  Palliative care has been consulted as well as oncology.   Assessment & Plan:   Principal Problem:   Abdominal pain Active Problems:   Hypercalcemia   Hepatocellular carcinoma (HCC)   Failure to thrive in adult  Hyponatremia   Protein-calorie malnutrition, severe (HCC)   AKI (acute kidney injury) (HCC)  Severe Hypercalcemia, ongoing agitation and confusion.  Resolved with zometa, calcitonin and IVF. - Zometa administered on 6/29 - Calcitonin administered on 6/29 and again on 8/11  Acute metabolic encephalopathy due to hypercalcemia and possible hepatic encephalopathy, improving -  Sitter remains at bedside -  Hypercalcemia resolved -  Decrease lactulose -  Appreciate psychiatry assistance. The patient does have capacity to make medical decisions  Hepatocellular carcinoma ([place]), due to underlying HCV.  With osseous metastases as seen on CT abdomen. -  AFP 86,531 -  Appreciate GI assistance -  Cancel biopsy, advance diet - MRCP demonstrated hepatomegaly with innumerable diffuse hepatic masses, retroperitoneal lymphadenopathy, increasing lytic bone metastases of the left ilium, worsening ascites body wall edema, and right pleural effusion. He was incidentally found to have an 8 cm mass in the central pelvis that was only partially imaged but there was no similar mass seen on CT pelvis - Palliative care consultation -  Oncology consultation  Unclear if patient was also an alcoholic -  CIWA, 9-14 -  Thiamine, folate, MVI  Failure to thrive in adult PT/OT     Hyponatremia due to cirrhosis and dehydration, improved with IVF    Protein-calorie malnutrition, severe ([place]) Nutrition consult appreciated - Regular diet with supplements    AKI (acute kidney injury) ([place]), due to dehydration and BC powders, resolved with IVF  Tobacco abuse Order nicotine patch.  Normocytic anemia, patient refusing labs  TSH elevated in the setting of severe illness, malignancy, dehydration, hypercalcemia -  Repeat  TSH in 2-4 weeks  Social situation, will not communicate again with the patient's mother with whom he has a poor relationship. The patient has capacity to make decisions for himself according to  psychiatry and he has asked me not to communicate with his family members anymore.  DVT prophylaxis:  SCDs Code Status:  full Family Communication:  Patient alone Disposition Plan:  PT/OT pending.  If oncology determines that the patient is not a good chemotherapy candidate, he may be a good candidate for hospice. Given his poor nutritional state, weakness, and progressive cancer he may be a candidate for residential hospice versus skilled nursing facility with hospice care if he chooses.  Otherwise he will be discharged home with hospice services, possibly tomorrow.     Consultants:   Gastroenterology, Dr. Alessandra Bevels  Radiology  Palliative care  Oncology  Procedures:  None  Antimicrobials:  Anti-infectives    None       Subjective:  Patient has been somewhat calm her. Security was not called overnight. His CIWA scores were as high as 12. He denies pains, shortness of breath but feels very groggy/sleepy this morning.  Objective: Vitals:   02/26/17 1415 02/26/17 2208 02/27/17 0618 02/27/17 1000  BP: 128/81 111/66 120/69   Pulse: 91 97 (!) 108 (!) 104  Resp: 20 18 20    Temp: 97.7 F (36.5 C) 97.6 F (36.4 C) 97.5 F (36.4 C)   TempSrc:  Oral Oral   SpO2: 98% 99% 98%   Weight:      Height:        Intake/Output Summary (Last 24 hours) at 02/27/17 1323 Last data filed at 02/27/17 0945  Gross per 24 hour  Intake              240 ml  Output              625 ml  Net             -385 ml   Filed Weights   02/24/17 1318  Weight: 86.2 kg (190 lb)    Examination:  General exam:  Adult male, cachectic, sleep, And difficult to arouse, skin appears gray with dark circles under his eyes HEENT:  Normocephalic, atraumatic, moist mucous membranes Respiratory system: Clear to auscultation bilaterally no increased work of breathing Cardiovascular system: Regular rate and rhythm, no murmurs, rubs or gallops, warm extremities Gastrointestinal system: NABS, soft, mildly  distended with hepatomegaly and the right upper and lower quadrants that is tender to palpation without rebound or guarding  MSK:  Normal tone and bulk, 1+ bilateral lower extremity edema Neuro:  Grossly moves all extremities, slow to answer questions, alert and oriented to place, time, but not reason for hospitalization at the time of my visit.  Data Reviewed: I have personally reviewed following labs and imaging studies  CBC:  Recent Labs Lab 02/24/17 1338 02/24/17 1629 02/25/17 0318 02/27/17 0912  WBC 10.9*  --  8.1 9.7  NEUTROABS 9.4*  --   --   --   HGB 14.2 16.0 12.3* 12.3*  HCT 42.8 47.0 37.3* 36.7*  MCV 97.5  --  98.4 97.6  PLT 246  --  212 419   Basic Metabolic Panel:  Recent Labs Lab 02/24/17 1338 02/24/17 1623 02/24/17 1629 02/24/17 2050 02/25/17 0318 02/27/17 0912  NA 126*  --  130* 130* 133* 133*  K 4.6  --  4.4 4.0 4.0 3.6  CL 92*  --  93* 95* 100* 102  CO2  24  --   --  23 24 16*  GLUCOSE 88  --  85 88 84 67  BUN 22*  --  28* 22* 15 16  CREATININE 1.27*  --  1.30* 1.09 0.97 0.97  CALCIUM >15.0*  --   --  13.8* 12.6* 10.3  MG  --  1.6*  --   --   --  1.2*  PHOS  --  2.3*  --   --   --   --    GFR: Estimated Creatinine Clearance: 96.7 mL/min (by C-G formula based on SCr of 0.97 mg/dL). Liver Function Tests:  Recent Labs Lab 02/24/17 1338 02/25/17 0318 02/27/17 0912  AST 133* 104* 99*  ALT 86* 66* 68*  ALKPHOS 166* 131* 128*  BILITOT 2.7* 2.7* 2.5*  PROT 7.8 6.6 6.4*  ALBUMIN 3.2* 2.5* 2.5*    Recent Labs Lab 02/24/17 1338  LIPASE 44    Recent Labs Lab 02/24/17 1605  AMMONIA 89*   Coagulation Profile:  Recent Labs Lab 02/24/17 2050 02/25/17 0318  INR 1.26 1.26   Cardiac Enzymes: No results for input(s): CKTOTAL, CKMB, CKMBINDEX, TROPONINI in the last 168 hours. BNP (last 3 results) No results for input(s): PROBNP in the last 8760 hours. HbA1C: No results for input(s): HGBA1C in the last 72 hours. CBG: No results for  input(s): GLUCAP in the last 168 hours. Lipid Profile: No results for input(s): CHOL, HDL, LDLCALC, TRIG, CHOLHDL, LDLDIRECT in the last 72 hours. Thyroid Function Tests:  Recent Labs  02/24/17 1623  TSH 10.518*   Anemia Panel: No results for input(s): VITAMINB12, FOLATE, FERRITIN, TIBC, IRON, RETICCTPCT in the last 72 hours. Urine analysis:    Component Value Date/Time   COLORURINE YELLOW 02/24/2017 2044   APPEARANCEUR CLEAR 02/24/2017 2044   LABSPEC 1.009 02/24/2017 2044   PHURINE 6.0 02/24/2017 2044   GLUCOSEU NEGATIVE 02/24/2017 2044   HGBUR NEGATIVE 02/24/2017 2044   BILIRUBINUR NEGATIVE 02/24/2017 2044   KETONESUR 20 (A) 02/24/2017 2044   PROTEINUR NEGATIVE 02/24/2017 2044   NITRITE NEGATIVE 02/24/2017 2044   LEUKOCYTESUR NEGATIVE 02/24/2017 2044   Sepsis Labs: @LABRCNTIP (procalcitonin:4,lacticidven:4)  )No results found for this or any previous visit (from the past 240 hour(s)).    Radiology Studies: Ct Head W & Wo Contrast  Result Date: 02/25/2017 CLINICAL DATA:  56 y/o M; hepatocellular carcinoma. Concern for intracranial metastasis. EXAM: CT HEAD WITHOUT AND WITH CONTRAST TECHNIQUE: Contiguous axial images were obtained from the base of the skull through the vertex without and with intravenous contrast CONTRAST:  53mL ISOVUE-300 IOPAMIDOL (ISOVUE-300) INJECTION 61% COMPARISON:  12/10/2016 CT head FINDINGS: Brain: No evidence of acute infarction, hemorrhage, hydrocephalus, extra-axial collection or mass lesion/mass effect. After administration of intravenous contrast there is no abnormal enhancement of the brain. Vascular: Mild calcific atherosclerosis of the carotid siphons. Skull: Normal. Negative for fracture or focal lesion. Sinuses/Orbits: No acute finding. Other: None. IMPRESSION: No intracranial metastatic disease identified. Unremarkable CT of the head with and without contrast. Electronically Signed   By: Kristine Garbe M.D.   On: 02/25/2017 23:07    Ct Pelvis W Contrast  Result Date: 02/26/2017 CLINICAL DATA:  Cirrhosis with HCC versus hepatic metastases seen on prior CT. Pelvic mass seen on prior MRI concerning for metastatic disease. EXAM: CT PELVIS WITH CONTRAST TECHNIQUE: Multidetector CT imaging of the pelvis was performed using the standard protocol following the bolus administration of intravenous contrast. CONTRAST:  142mL ISOVUE-300 IOPAMIDOL (ISOVUE-300) INJECTION 61% COMPARISON:  CT 01/10/2017 and 01/24/2017.  MRI 02/25/2017. FINDINGS: Urinary Tract: Ureters are decompressed. Urinary bladder unremarkable. Bowel: Sigmoid diverticulosis. Pelvic small bowel decompressed and unremarkable. Vascular/Lymphatic: Aortic and iliac calcifications. No aneurysm. No adenopathy in the lower retroperitoneum or pelvis. Reproductive:  No visible focal abnormality. Other: No mass seen in the central pelvis as seen on prior MRI. Moderate to large volume free fluid in the pelvis. Musculoskeletal: Enlarging soft tissue sec enlarging areas of bony destruction and soft tissue masses involving the posterior right acetabulum measures up to 4.7 cm compared to 3.1 cm on prior CT. Enlarging destructive mass with soft tissue component in the left iliac bone measuring up to 5.3 cm compared to 2.8 cm previously. IMPRESSION: No central pelvic mass noted on today's CT as suggested on prior MRI. There is large volume free fluid in the pelvis. Enlarging soft tissue masses and bony destruction in the left iliac bone and right posterior acetabulum. This is compatible with worsening metastatic disease. Sigmoid diverticulosis. Aortoiliac atherosclerosis. Electronically Signed   By: Rolm Baptise M.D.   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 EXAM: MRI ABDOMEN WITHOUT CONTRAST  (INCLUDING MRCP) TECHNIQUE: Multiplanar multisequence MR imaging of the abdomen was performed. Heavily  T2-weighted images of the biliary and pancreatic ducts were obtained, and three-dimensional MRCP images were rendered by post processing. Patient refused to continue exam due to pain, and therefore no intravenous contrast was administered. COMPARISON:  CT on 01/24/2017 and 01/10/2017 FINDINGS: Lower chest: Tiny right pleural effusion. Hepatobiliary: Image degradation by respiratory motion artifact noted. Hepatomegaly again demonstrated. Numerous T2 hyperintense soft tissue masses are seen involving the liver diffusely. Comparison with previous CT is limited by differences in modalities. Allowing for this, no significant interval change is demonstrated. Gallbladder is unremarkable. No evidence of biliary ductal dilatation. Pancreas: No mass or inflammatory process visualized on this unenhanced exam. Spleen:  Within normal limits in size. Adrenals/Urinary tract: Unremarkable. No evidence of urolithiasis or hydronephrosis. Stomach/Bowel: No evidence of obstruction, inflammatory process, or abnormal fluid collections. Vascular/Lymphatic: Stable lymphadenopathy in porta hepatis with index lymph node measuring 2.8 cm on image 18/8. Retroperitoneal lymphadenopathy again seen in the pericaval space, with index lymph node measuring 1.9 cm on image 17/8. This also shows no significant change compared to previous study. No evidence of abdominal aortic aneurysm. Other: Mild perihepatic ascites is increased. Increased diffuse body wall edema. Pelvic ascites is also seen. An irregular soft tissue mass is seen involving bowel loops in the central pelvis which mentions approximately cm common is suspicious for metastatic disease. Musculoskeletal: Lytic bone metastasis and associated soft tissue mass involving the left ilium has increased in size since previous study, currently measuring 6 cm compared to 3 cm previously. This is consistent with a bone metastasis. IMPRESSION: Image degradation by motion artifact noted. Hepatomegaly  and innumerable diffuse hepatic masses show no significant change since prior CT allowing for differences in modalities. Porta hepatis and retroperitoneal lymphadenopathy, without significant change. New 8 cm irregular soft tissue mass in the central pelvis involving bowel loops, highly suspicious for metastatic disease. Pelvis CT is suggested further evaluation. Increased size of lytic bone metastasis involving the left ilium. Interval increase in mild ascites, diffuse body wall edema, and tiny right pleural effusion. Electronically Signed   By: Earle Gell M.D.   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 EXAM: MRI  ABDOMEN WITHOUT CONTRAST  (INCLUDING MRCP) TECHNIQUE: Multiplanar multisequence MR imaging of the abdomen was performed. Heavily T2-weighted images of the biliary and pancreatic ducts were obtained, and three-dimensional MRCP images were rendered by post processing. Patient refused to continue exam due to pain, and therefore no intravenous contrast was administered. COMPARISON:  CT on 01/24/2017 and 01/10/2017 FINDINGS: Lower chest: Tiny right pleural effusion. Hepatobiliary: Image degradation by respiratory motion artifact noted. Hepatomegaly again demonstrated. Numerous T2 hyperintense soft tissue masses are seen involving the liver diffusely. Comparison with previous CT is limited by differences in modalities. Allowing for this, no significant interval change is demonstrated. Gallbladder is unremarkable. No evidence of biliary ductal dilatation. Pancreas: No mass or inflammatory process visualized on this unenhanced exam. Spleen:  Within normal limits in size. Adrenals/Urinary tract: Unremarkable. No evidence of urolithiasis or hydronephrosis. Stomach/Bowel: No evidence of obstruction, inflammatory process, or abnormal fluid collections. Vascular/Lymphatic: Stable lymphadenopathy in porta hepatis with index  lymph node measuring 2.8 cm on image 18/8. Retroperitoneal lymphadenopathy again seen in the pericaval space, with index lymph node measuring 1.9 cm on image 17/8. This also shows no significant change compared to previous study. No evidence of abdominal aortic aneurysm. Other: Mild perihepatic ascites is increased. Increased diffuse body wall edema. Pelvic ascites is also seen. An irregular soft tissue mass is seen involving bowel loops in the central pelvis which mentions approximately cm common is suspicious for metastatic disease. Musculoskeletal: Lytic bone metastasis and associated soft tissue mass involving the left ilium has increased in size since previous study, currently measuring 6 cm compared to 3 cm previously. This is consistent with a bone metastasis. IMPRESSION: Image degradation by motion artifact noted. Hepatomegaly and innumerable diffuse hepatic masses show no significant change since prior CT allowing for differences in modalities. Porta hepatis and retroperitoneal lymphadenopathy, without significant change. New 8 cm irregular soft tissue mass in the central pelvis involving bowel loops, highly suspicious for metastatic disease. Pelvis CT is suggested further evaluation. Increased size of lytic bone metastasis involving the left ilium. Interval increase in mild ascites, diffuse body wall edema, and tiny right pleural effusion. Electronically Signed   By: Earle Gell M.D.   On: 02/26/2017 09:33     Scheduled Meds: . feeding supplement (ENSURE ENLIVE)  237 mL Oral TID BM  . folic acid  1 mg Oral Daily  . lactulose  10 g Oral BID  . LORazepam  0-4 mg Intravenous Q12H  . mouth rinse  15 mL Mouth Rinse BID  .  morphine injection  4 mg Intravenous Once  . multivitamin with minerals  1 tablet Oral Daily  . nicotine  21 mg Transdermal Daily  . ondansetron (ZOFRAN) IV  4 mg Intravenous Once  . pneumococcal 23 valent vaccine  0.5 mL Intramuscular Once  . sodium chloride flush  3 mL  Intravenous Q12H  . thiamine  100 mg Oral Daily   Or  . thiamine  100 mg Intravenous Daily   Continuous Infusions:    LOS: 3 days    Time spent: 30 min    Janece Canterbury, MD [facility] Pager [phone]  If 7PM-7AM, please contact night-coverage [email or web address] Password [email or web address] 02/27/2017, 1:23 PM

## 2017-02-27 NOTE — Progress Notes (Signed)
[facility] Gastroenterology Progress Note  Dennis Bell 56 y.o. 09-Aug-1961   Subjective: Somnolent. Denies abdominal pain. Sitter at bedside.  Objective: Vital signs in last 24 hours: Vitals:   02/27/17 0618 02/27/17 1000  BP: 120/69   Pulse: (!) 108 (!) 104  Resp: 20   Temp: 97.5 F (36.4 C)     Physical Exam: Gen: somnolent, cachetic, no acute distress HEENT: poor dentition CV: RRR Chest: CTA anteriorly Abd: diffusely tender with guarding, +distention, +BS Ext: no edema  Lab Results:  Recent Labs  02/24/17 1623  02/25/17 0318 02/27/17 0912  NA  --   < > 133* 133*  K  --   < > 4.0 3.6  CL  --   < > 100* 102  CO2  --   < > 24 16*  GLUCOSE  --   < > 84 67  BUN  --   < > 15 16  CREATININE  --   < > 0.97 0.97  CALCIUM  --   < > 12.6* 10.3  MG 1.6*  --   --  1.2*  PHOS 2.3*  --   --   --   < > = values in this interval not displayed.  Recent Labs  02/25/17 0318 02/27/17 0912  AST 104* 99*  ALT 66* 68*  ALKPHOS 131* 128*  BILITOT 2.7* 2.5*  PROT 6.6 6.4*  ALBUMIN 2.5* 2.5*    Recent Labs  02/24/17 1338  02/25/17 0318 02/27/17 0912  WBC 10.9*  --  8.1 9.7  NEUTROABS 9.4*  --   --   --   HGB 14.2  < > 12.3* 12.3*  HCT 42.8  < > 37.3* 36.7*  MCV 97.5  --  98.4 97.6  PLT 246  --  212 163  < > = values in this interval not displayed.  Recent Labs  02/24/17 2050 02/25/17 0318  LABPROT 15.8* 15.9*  INR 1.26 1.26      Assessment/Plan: Presumed metastatic hepatocellular carcinoma with mets to the bones in the setting of Hep C cirrhosis. Doubt he will be a candidate for any [place] treatment due to his deconditioned state and I do not think a liver biopsy will change management. Would recommend oncology consult to decide if imaging findings on MRCP and CT along with markedly elevated AFP (86,000) are sufficient to diagnosed Dennis Bell and help discuss whether he would be a candidate for any treatment. I think he needs palliative measures due to his severe  malnourishment and deconditioned state. Will defer to Dr. Sheran Bell whether to call oncology at this time. Will sign off. Call if questions.   [place] [facility]. 02/27/2017, 12:23 PM   Pager [phone]  AFTER 5 pm or on weekends call [phone] ID: Dennis Bell, male   DOB: [birth year], 56 y.o.   MRN: [id number]

## 2017-02-27 NOTE — Progress Notes (Signed)
COURTESY NOTE: For a good summary of the patient's current situation, see Ms Bosie Helper note earlier today.  I spent approximately 20 minutes with Michail making sure he understood what was going on and trying to understand what he wants to do.  Milind tells me he has been told he is going to die. He understands it may be a few days. What he wants to do those few days is "live." More specifically he wants to leave the hospital--he is eager to sign himself out AMA and go home.  He says facing death is "hard." He tells me he is not a religious person. When I asked him who helps him he said "God." He does talk to God. He tells him he wants to "get out of here and go home."  We discussed the fact that his mother cannot care for him 24/7. He needs much more care than she can provide. He was able to understand that. I told him we could place a Hospice referral he would be taken care of 24/7 and his mother could visit with him as long as she liked without needing to do the work.  He said "that would be fine."  In summary:  Given the CT findings and the very elevated AFP, I am comfortable with a working diagnosis of hepatocellular CA in the setting of hepatitis C. He is not a candidate for surgery or ablative procedures as the tumor is already outside the liver.  At this point I believe systemic therapy is much more likely to hurt than help him  I called the patient's mother and discussed this with her. She understands she will not be able to take him home.  Accordingly my recommendation is for comfort care, specifically with [facility] referral with a view to transfer to their [place] so he can be more easily visited by his mother.  Please let me know if I can be of further help.

## 2017-02-27 NOTE — Progress Notes (Signed)
PT Cancellation Note  Patient Details Name: Dennis Bell MRN: [id number] DOB: 11-05-1960   Cancelled Treatment:    Reason Eval/Treat Not Completed: Other (comment), pt has been very restless and in a lot of pain and has just fallen asleep. RN in agreement to let him rest at this time. Will check back tomorrow if PT input is still desired.    [place] 02/27/2017, 6:32 PM

## 2017-02-27 NOTE — Progress Notes (Signed)
Patient refused I and O cath this am on several occasions. Will continue to monitor.

## 2017-02-27 NOTE — Consult Note (Signed)
Consultation Note Date: 02/27/2017   Patient Name: Dennis Bell  DOB: [birth year]  MRN: [id number]  Age / Sex: 56 y.o., male  PCP: Patient, No Pcp Per Referring Physician: Janece Canterbury, MD  Reason for Consultation: Establishing goals of care and Psychosocial/spiritual support  HPI/Patient Profile: 56 y.o. male   admitted on 02/24/2017 with history of hepatitis C, tobacco abuse, PTSD who is brought to the emergency department by his mother for abdominal pain, generalized weakness, weight loss, and anorexia.    He was seen at [facility] on 12/28/2016 for abdominal pain and found to have hepatitis C, hypercalcemia, transaminitis. He had a CT of the abdomen and pelvis done on 5/15 which demonstrated hepatomegaly with diffuse hepatic metastasis versus multifocal hepatocellular carcinoma with cirrhosis. He had lytic lesions of the left iliac crest, right acetabulum. He has had abdominal adenopathy and a nonspecific right lung base nodule.   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 He never follow-up for liver biopsy.   According to the patient/ and his mother he left her house and  [place]  He tells me this is where he prefers to live.    In the emergency department, he had a mild tachycardia and tachypnea. His sodium was 126 and his calcium was greater than 15. He continues to have a transaminitis with a total bilirubin of 2.7. He was given IV morphine, IV fluids, calcitonin, Ativan.  His calcium has trended down to 12.6.   Patient had to be involuntarily committed secondary to agitation, confusion, attempting to leave.  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    Currently patient, family and providers are facing treatment option decisions and anticipatory care needs.   Psychiatry found patient to have capacity on their assessment.  Today he is participating in [place] discussion.     Clinical Assessment and Goals of Care:  This NP Wadie Lessen reviewed medical records, received report from team, assessed the patient and then meet at the patient's bedside along with his mother  to discuss diagnosis, prognosis, GOC, EOL wishes disposition and options.  A discussion was had today regarding advanced directives.  Concepts specific to code status, artifical feeding and hydration,  and rehospitalization was had.  The difference between a aggressive medical intervention path  and a palliative comfort care path for this patient at this time was had.  Values and goals of care important to patient  were attempted to be elicited.  Concept of Hospice was discussed  Patient verbalizes that he does not want to be hospitalized, does not want any further testing and is not interested in cancer treatment.   We discussed his limited prognosis in this scenario.    Natural trajectory and expectations at EOL were discussed.  Questions and  concerns addressed. PMT will continue to support holistically.    PATIENT today is engaged in conversation regarding GOCs.  His mother is present, she is his only support person. Their relationship is poor, mother tells me he has been  verbally abusive to her  in the past According to his mother he has alienated himself from everyone; family and friends.      SUMMARY OF RECOMMENDATIONS   Code Status/Advance Care Planning:   DNR-documented today, mother who is present support patient's decsion   Symptom Management:   Agitation: Continue to utilize PRNS per EMR   Add --Seroquel 25 mg po qhs  Palliative Prophylaxis:   Aspiration, Delirium Protocol, Frequent Pain Assessment and Oral Care  Additional  Recommendations (Limitations, Scope, Preferences):  Await oncology's input for expert opinion on viable treatment options within the context of the patient's functional status and psycho-social components.  Psycho-social/Spiritual:   Desire for further Chaplaincy support:no  Additional Recommendations: Education on Hospice  Prognosis:  Patient has had dramatic physical, functional and cognitive decline over the past several months.  He is taking only sips and bites.    Will re-evaluate in the morning but I believe he will be hospice facility appropriate with a prognosis of weeks.  Family is interested in [facility]   Discharge Planning: To Be Determined      Primary Diagnoses: Present on Admission: . Hypercalcemia . Hepatocellular carcinoma ([place]) . Failure to thrive in adult . Abdominal pain . Hyponatremia . Protein-calorie malnutrition, severe ([place]) . AKI (acute kidney injury) ([place])   I have reviewed the medical record, interviewed the patient and family, and examined the patient. The following aspects are pertinent.  Past Medical History:  Diagnosis Date  . Chronic back pain   . Hepatitis C   . PTSD (post-traumatic stress disorder)    Social History   Social History  . Marital status: Single    Spouse name: N/A  . Number of children: N/A  . Years of education: N/A   Social History Main Topics  . Smoking status: Current Every Day Smoker    Types: Cigarettes  . Smokeless tobacco: Never Used  . Alcohol use No  . Drug use: Yes    Types: Marijuana  . Sexual activity: Not Asked   Other Topics Concern  . None   Social History Narrative  . None   Family History  Problem Relation Age of Onset  . Hypertension Mother    Scheduled Meds: . feeding supplement (ENSURE ENLIVE)  237 mL Oral TID BM  . folic acid  1 mg Oral Daily  . [START ON 02/28/2017] lactulose  10 g Oral Daily  . LORazepam  0-4 mg Intravenous Q12H  . mouth rinse  15 mL Mouth  Rinse BID  .  morphine injection  4 mg Intravenous Once  . multivitamin with minerals  1 tablet Oral Daily  . nicotine  21 mg Transdermal Daily  . ondansetron (ZOFRAN) IV  4 mg Intravenous Once  . pneumococcal 23 valent vaccine  0.5 mL Intramuscular Once  . sodium chloride flush  3 mL Intravenous Q12H  . thiamine  100 mg Oral Daily   Or  . thiamine  100 mg Intravenous Daily   Continuous Infusions: PRN Meds:.acetaminophen, haloperidol lactate, ibuprofen, LORazepam **OR** LORazepam, morphine injection, promethazine Medications Prior to Admission:  Prior to Admission medications   Not on File   Allergies  Allergen Reactions  . Codeine Other (See Comments)    "screws me  up"  . Penicillins Nausea And Vomiting    Has patient had a PCN reaction causing immediate rash, facial/tongue/throat swelling, SOB or lightheadedness with hypotension: No Has patient had a PCN reaction causing severe rash involving mucus membranes or skin necrosis: No Has patient had a PCN reaction that required hospitalization: No Has patient had a PCN reaction occurring within the last 10 years: Yes If all of the above answers are "NO", then may proceed with Cephalosporin use.    Review of Systems  Neurological: Positive for weakness.    Physical Exam  Constitutional: He appears cachectic. He appears ill.  HENT:  Mouth/Throat: Oropharynx is clear and moist. Abnormal dentition. Dental caries present.  Cardiovascular: Tachycardia present.   Pulmonary/Chest: He has decreased breath sounds in the right lower field and the left lower field.  Musculoskeletal:  BLE +2 edema  Skin: Skin is warm and dry.    Vital Signs: BP 120/69 (BP Location: Right Arm)   Pulse (!) 104   Temp 97.5 F (36.4 C) (Oral)   Resp 20   Ht 6' (1.829 m)   Wt 86.2 kg (190 lb)   SpO2 98%   BMI 25.77 kg/m  Pain Assessment: No/denies pain   Pain Score: 0-No pain   SpO2: SpO2: 98 % O2 Device:SpO2: 98 % O2 Flow Rate: .   IO:  Intake/output summary:  Intake/Output Summary (Last 24 hours) at 02/27/17 1459 Last data filed at 02/27/17 1300  Gross per 24 hour  Intake                0 ml  Output              625 ml  Net             -625 ml    LBM: Last BM Date: 02/26/17 Baseline Weight: Weight: 86.2 kg (190 lb) Most recent weight: Weight: 86.2 kg (190 lb)      Palliative Assessment/Data: 30 %   Discussed with Dr Sheran Fava  Time In: 1445 Time Out: 1600 Time Total: 75 min Greater than 50%  of this time was spent counseling and coordinating care related to the above assessment and plan.  Signed by: Wadie Lessen, NP   Please contact Palliative Medicine Team phone at [phone] for questions and concerns.  For individual provider: See Shea Evans

## 2017-02-28 DIAGNOSIS — C220 Liver cell carcinoma: Secondary | ICD-10-CM

## 2017-02-28 DIAGNOSIS — R0600 Dyspnea, unspecified: Secondary | ICD-10-CM

## 2017-02-28 DIAGNOSIS — R1084 Generalized abdominal pain: Secondary | ICD-10-CM

## 2017-02-28 DIAGNOSIS — C7951 Secondary malignant neoplasm of bone: Secondary | ICD-10-CM

## 2017-02-28 MED ORDER — IBUPROFEN 400 MG PO TABS: 400.0000 mg | ORAL_TABLET | ORAL | Status: DC | PRN

## 2017-02-28 MED ORDER — LORAZEPAM 2 MG/ML IJ SOLN: 1.0000 mg | Freq: Four times a day (QID) | INTRAMUSCULAR | 0 refills | Status: AC | PRN

## 2017-02-28 MED ORDER — ENSURE ENLIVE PO LIQD: 237.0000 mL | Freq: Three times a day (TID) | ORAL | 12 refills | Status: AC

## 2017-02-28 MED ORDER — QUETIAPINE FUMARATE 25 MG PO TABS: 25.0000 mg | ORAL_TABLET | Freq: Every day | ORAL | Status: AC

## 2017-02-28 MED ORDER — HALOPERIDOL LACTATE 5 MG/ML IJ SOLN: 2.0000 mg | Freq: Four times a day (QID) | INTRAMUSCULAR | Status: AC | PRN

## 2017-02-28 MED ORDER — ACETAMINOPHEN 325 MG PO TABS: 650.0000 mg | ORAL_TABLET | Freq: Four times a day (QID) | ORAL | Status: DC | PRN

## 2017-02-28 MED ORDER — MORPHINE SULFATE (CONCENTRATE) 10 MG/0.5ML PO SOLN
5.0000 mg | ORAL | Status: DC | PRN
  Administered 2017-02-28: 5 mg via ORAL
  Filled 2017-02-28 (×2): qty 0.5

## 2017-02-28 MED ORDER — MORPHINE SULFATE (CONCENTRATE) 10 MG/0.5ML PO SOLN: 5.0000 mg | ORAL | Status: AC | PRN

## 2017-02-28 MED ORDER — ACETAMINOPHEN 325 MG PO TABS: 650.0000 mg | ORAL_TABLET | Freq: Four times a day (QID) | ORAL | Status: AC | PRN

## 2017-02-28 MED ORDER — MORPHINE SULFATE (PF) 2 MG/ML IV SOLN: 2.0000 mg | INTRAVENOUS | 0 refills | Status: AC | PRN

## 2017-02-28 MED ORDER — IBUPROFEN 400 MG PO TABS: 400.0000 mg | ORAL_TABLET | ORAL | 0 refills | Status: AC | PRN

## 2017-02-28 MED ORDER — NICOTINE 21 MG/24HR TD PT24: 21.0000 mg | MEDICATED_PATCH | Freq: Every day | TRANSDERMAL | 0 refills | Status: AC

## 2017-02-28 NOTE — Progress Notes (Signed)
Pt has been picked by [facility] to [facility], pt mother has been notified as well as the facility, iv line removed pt refused all his scheduled medication

## 2017-02-28 NOTE — Progress Notes (Signed)
PT Cancellation Note  Patient Details Name: Dennis Bell MRN: [id number] DOB: Sep 25, 1960   Cancelled Treatment:     Signing off due to transition to comfort care. 02/28/2017  Dennis Bell, [name] [phone]  (pager)   Dennis Bell 02/28/2017, 12:43 PM

## 2017-02-28 NOTE — Progress Notes (Signed)
Nutrition Brief Note  Chart reviewed. Pt now transitioning to comfort care.  No further nutrition interventions warranted at this time.  Please re-consult as needed.   Tytiana Coles A. Corry Storie, RD, LDN, CDE Pager: [phone] After hours Pager: [phone]  

## 2017-02-28 NOTE — Progress Notes (Signed)
Patient will DC to: [facility] Anticipated DC date: 02/28/17 Family notified: Mother Transport by: Corey Harold   Per MD patient ready for DC to Hospice. RN, patient, patient's family, and facility notified of DC. Discharge Summary sent to facility. RN given number for report. DC packet on chart. Ambulance transport requested for patient.   CSW signing off.  Cedric Fishman, [place] Social Worker [phone]

## 2017-02-28 NOTE — Progress Notes (Signed)
[facility] PROGRESS NOTE  Dennis Bell WCH:[id number] DOB: [birth year] DOA: 02/24/2017  PCP: Patient, No Pcp Per  Brief History/Interval Summary: 56 year old Caucasian male with a past medical history of hepatitis C, tobacco abuse, PTSD, who was brought in to the emergency department with complaints of abdominal pain, generalized weakness, weight loss and anorexia. CT scan done recently in May revealed hepatomegaly with diffuse hepatic metastases versus multifocal hepatocellular carcinoma with cirrhosis. Lytic lesions were found in the left iliac crest and right acetabulum. CT repeated on May 29 showed progression of this disease. In the emergency department, patient was found to be mildly tachycardic. He was hyponatremic and had hyperkalemia. He was given fluids, calcitonin. His calcium level started improving. He had to be involuntarily committed due to agitation, confusion and attempting to leave. His alpha-fetoprotein was found to be 86,000. Subsequently palliative medicine and oncology was consulted. Patient is thought to have extremely poor prognosis due to metastatic hepatocellular carcinoma with life expectancy of under 2 weeks.   Reason for Visit: Metastatic hepatocellular carcinoma  Consultants: Oncology. Palliative medicine. Gastroenterology. Psychiatry  Procedures: None  Antibiotics: None  Subjective/Interval History: Patient is somewhat confused and distracted. Complains of pain in his abdomen and about 7-8 out of 10 in intensity. Denies any difficulty breathing.  ROS: No nausea or vomiting  Objective:  Vital Signs  Vitals:   02/27/17 1459 02/27/17 1812 02/27/17 2213 02/28/17 0401  BP: 106/62 121/84 115/71 (!) 142/91  Pulse: (!) 104 (!) 108 (!) 133 (!) 134  Resp: 18 (!) 28 18 20   Temp: 97.5 F (36.4 C)  97.5 F (36.4 C) 97.8 F (36.6 C)  TempSrc:   Oral   SpO2: 94% 98% 93% 95%  Weight:      Height:        Intake/Output Summary (Last 24 hours) at  02/28/17 1159 Last data filed at 02/28/17 3536  Gross per 24 hour  Intake              250 ml  Output             1000 ml  Net             -750 ml   Filed Weights   02/24/17 1318  Weight: 86.2 kg (190 lb)    General appearance: alert, cooperative, appears stated age, distracted and no distress Head: Normocephalic, without obvious abnormality, atraumatic Resp: Coarse breath sounds bilaterally. Crackles at the bases bilaterally. No wheezing. Cardio: Assessment as to his tachycardic, regular. No S3, S4. No rubs, murmurs, or bruit GI: Abdomen was not fully examined the since the patient has known hepatomegaly and has abdominal pain Extremities: Bilateral pedal edema is noted Neurologic: Somewhat distracted. But no focal neurological deficits noted.  Lab Results:  Data Reviewed: I have personally reviewed following labs and imaging studies  CBC:  Recent Labs Lab 02/24/17 1338 02/24/17 1629 02/25/17 0318 02/27/17 0912  WBC 10.9*  --  8.1 9.7  NEUTROABS 9.4*  --   --   --   HGB 14.2 16.0 12.3* 12.3*  HCT 42.8 47.0 37.3* 36.7*  MCV 97.5  --  98.4 97.6  PLT 246  --  212 144    Basic Metabolic Panel:  Recent Labs Lab 02/24/17 1338 02/24/17 1623 02/24/17 1629 02/24/17 2050 02/25/17 0318 02/27/17 0912  NA 126*  --  130* 130* 133* 133*  K 4.6  --  4.4 4.0 4.0 3.6  CL 92*  --  93* 95* 100* 102  CO2 24  --   --  23 24 16*  GLUCOSE 88  --  85 88 84 67  BUN 22*  --  28* 22* 15 16  CREATININE 1.27*  --  1.30* 1.09 0.97 0.97  CALCIUM >15.0*  --   --  13.8* 12.6* 10.3  MG  --  1.6*  --   --   --  1.2*  PHOS  --  2.3*  --   --   --   --     GFR: Estimated Creatinine Clearance: 96.7 mL/min (by C-G formula based on SCr of 0.97 mg/dL).  Liver Function Tests:  Recent Labs Lab 02/24/17 1338 02/25/17 0318 02/27/17 0912  AST 133* 104* 99*  ALT 86* 66* 68*  ALKPHOS 166* 131* 128*  BILITOT 2.7* 2.7* 2.5*  PROT 7.8 6.6 6.4*  ALBUMIN 3.2* 2.5* 2.5*     Recent  Labs Lab 02/24/17 1338  LIPASE 44    Recent Labs Lab 02/24/17 1605  AMMONIA 89*    Coagulation Profile:  Recent Labs Lab 02/24/17 2050 02/25/17 0318  INR 1.26 1.26    Medications:  Scheduled: . feeding supplement (ENSURE ENLIVE)  237 mL Oral TID BM  . LORazepam  0-4 mg Intravenous Q12H  . mouth rinse  15 mL Mouth Rinse BID  .  morphine injection  4 mg Intravenous Once  . nicotine  21 mg Transdermal Daily  . ondansetron (ZOFRAN) IV  4 mg Intravenous Once  . pneumococcal 23 valent vaccine  0.5 mL Intramuscular Once  . QUEtiapine  25 mg Oral QHS  . sodium chloride flush  3 mL Intravenous Q12H  . 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  With osseous metastases as seen on CT abdomen. -  AFP 86,531 -  Appreciate GI assistance - MRCP demonstrated hepatomegaly with innumerable diffuse hepatic masses, retroperitoneal lymphadenopathy, increasing lytic bone metastases of the left ilium, worsening ascites body wall edema, and right pleural effusion. He was incidentally found to have an 8 cm mass in the central pelvis that was only partially imaged but there was no similar mass seen on CT pelvis - Oncology was consulted. Able to make a diagnosis of hepatocellular carcinoma based on imaging study as well as elevated alpha-fetoprotein. Patient is thought to have very poor prognosis. He is not a candidate for any treatment. Hospice has  been recommended. His life expectancy is thought to be short enough that he may benefit from residential hospice. Social worker has been consulted to assist with this disposition.  Palliative medicine is adjusting his pain medications.  SevereHypercalcemia Resolved with zometa, calcitonin and IVF. - Zometa administered on 6/29 - Calcitonin administered on 6/29 and again on 1/47  Acute metabolic encephalopathy due to hypercalcemia and possible hepatic encephalopathy -  Sitter remains at bedside -  Hypercalcemia resolved -  . He was also given lactulose -  Appreciate psychiatry assistance. The patient does have capacity to make medical decisions  Unclear if patient was  also an alcoholic -  Thiamine, folate, MVI Not actively withdrawing.  Failure to thrive in adult PT/OT   Hyponatremia due to cirrhosis and dehydration Improved with IVF  Protein-calorie malnutrition, severe Nutrition consult appreciated - Regular diet with supplements  AKI (acute kidney injury), due to dehydration and BC powders Resolved with IVF  Tobacco abuse nicotine patch.  Normocytic anemia Hemoglobin is stable  Abnormal thyroid function tests TSH was noted to be elevated at 10.5. No further management considering poor prognosis from his cancer.   Social situation Patient prefers no communication with family members. He has been deemed to have capacity to decide for himself according to psychiatry.  DVT Prophylaxis: SCDs    Code Status: DO NOT RESUSCITATE  Family Communication: None  Disposition Plan: Residential hospice referral has been made by palliative medicine.    LOS: 4 days   [place] [facility] Pager [phone] 02/28/2017, 11:59 AM  If 7PM-7AM, please contact night-coverage at [email or web address], password [facility]

## 2017-02-28 NOTE — Progress Notes (Signed)
[facility] Progress Note  Dennis Bell 56 y.o. 16-Mar-1961   Subjective: Sitting up on the side of the bed requesting "Oxy" and he is saying he wants that pain med. Feels short of breath. Sitter in room.  Objective: Vital signs: Vitals:   02/27/17 2213 02/28/17 0401  BP: 115/71 (!) 142/91  Pulse: (!) 133 (!) 134  Resp: 18 20  Temp: 97.5 F (36.4 C) 97.8 F (36.6 C)    Physical Exam: Gen: cachetic, lethargic, +acute distress CV: RRR Chest: coarse breath sounds  Abd: diffusely tender with guarding, +distention, +BS Ext: 1+ LE edema  Lab Results:  Recent Labs  02/27/17 0912  NA 133*  K 3.6  CL 102  CO2 16*  GLUCOSE 67  BUN 16  CREATININE 0.97  CALCIUM 10.3  MG 1.2*    Recent Labs  02/27/17 0912  AST 99*  ALT 68*  ALKPHOS 128*  BILITOT 2.5*  PROT 6.4*  ALBUMIN 2.5*    Recent Labs  02/27/17 0912  WBC 9.7  HGB 12.3*  HCT 36.7*  MCV 97.6  PLT 163      Assessment/Plan: Metastatic hepatocellular carcinoma (based on imaging). Agree with comfort care and hospice referral per oncology's note. Pain meds per primary team. Will sign off.   [place] C. 02/28/2017, 8:21 AM  Pager [phone]  AFTER 5 PM or on weekends please call [phone] ID: Dennis Bell, male   DOB: [birth year], 56 y.o.   MRN: [id number]

## 2017-02-28 NOTE — Care Management Important Message (Signed)
Important Message  Patient Details  Name: Dennis Bell MRN: [id number] Date of Birth: 1961-08-08   Medicare Important Message Given:  Yes    Nathen May 02/28/2017, 10:31 AM

## 2017-02-28 NOTE — Progress Notes (Addendum)
Patient has been accepted by [facility] and will transfer there today. DC Summary sent to facility.  [facility] is reviewing referral.  Neysa Hotter [phone]

## 2017-02-28 NOTE — Discharge Summary (Signed)
[facility]  Physician Discharge Summary   Patient ID: Dennis Bell MRN: [id number] DOB/AGE: 04/24/1961 56 y.o.  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 Patient to go to residential hospice  DISCHARGE CONDITION: poor  Diet recommendation: Comfort feeds  Filed Weights   02/24/17 1318  Weight: 86.2 kg (190 lb)    INITIAL HISTORY: 56 year old Caucasian male with a past medical history of hepatitis C, tobacco abuse, PTSD, who was brought in to the emergency department with complaints of abdominal pain, generalized weakness, weight loss and anorexia. CT scan done recently in May revealed hepatomegaly with diffuse hepatic metastases versus multifocal hepatocellular carcinoma with cirrhosis. Lytic lesions were found in the left iliac crest and right acetabulum. CT repeated on May 29 showed progression of this disease. In the emergency department, patient was found to be mildly tachycardic. He was hyponatremic and had hyperkalemia. He was given fluids, calcitonin. His calcium level started improving. He had to be involuntarily committed due to agitation, confusion and attempting to leave. His alpha-fetoprotein was found to be 86,000. Subsequently palliative medicine and oncology was consulted. Patient is thought to have extremely poor prognosis due to metastatic hepatocellular carcinoma with life expectancy of under 2 weeks.   Consultants: Oncology. Palliative medicine. Gastroenterology. Psychiatry   HOSPITAL COURSE:     Metastatic Hepatocellular carcinoma  Due to underlying HCV. With osseous metastases as seen on CT abdomen. AFP 86,531. MRCP demonstrated hepatomegaly with innumerable diffuse hepatic masses, retroperitoneal lymphadenopathy, increasing lytic bone metastases of the left ilium, worsening ascites body wall edema, and right pleural effusion. He was incidentally found to have an 8 cm mass in the central pelvis that was only partially imaged but there was no similar mass seen on CT pelvis Oncology was consulted. Able to make a diagnosis of hepatocellular carcinoma based on imaging study as well as elevated alpha-fetoprotein. Patient is thought to have very poor prognosis. He is not a candidate for any treatment. Hospice has been recommended. His life expectancy is thought to be short enough (less than 2 weeks) that he may benefit from residential hospice. Patient's pain medications has been adjusted. Patient has a bed available at residential hospice at [facility].  SevereHypercalcemia Resolved with zometa, calcitonin and IVF. - Zometa administered on 6/29 - Calcitonin administered on 6/29 and again on 6/14  Acute metabolic encephalopathy due to hypercalcemia and possible hepatic encephalopathy - Hypercalcemia resolved - . He was also given lactulose - Appreciate psychiatry assistance. The patient does have capacity to make medical decisions  Hyponatremia due to cirrhosis and dehydration Improved with IVF  Protein-calorie malnutrition, severe Nutrition consult appreciated  AKI (acute kidney injury), due to dehydration and BC powders Resolved with IVF  Tobacco abuse nicotine patch.  Normocytic anemia Hemoglobin is stable  Abnormal thyroid function tests TSH was noted to be elevated at 10.5. No further management considering poor prognosis from his cancer.   Social situation Patient prefers no communication with family members. He has been deemed to have capacity to  decide for himself according to psychiatry.  Patient has poor prognosis with life expectancy less than 2 weeks. He will be discharged residential hospice.   PERTINENT LABS:  The results of significant diagnostics from this hospitalization (including imaging, microbiology, ancillary and laboratory) are listed below for reference.      Labs: Basic Metabolic Panel:  Recent Labs Lab 02/24/17 1338 02/24/17 1623 02/24/17 1629 02/24/17 2050 02/25/17 0318 02/27/17 0912  NA 126*  --  130* 130* 133* 133*  K 4.6  --  4.4 4.0 4.0 3.6  CL 92*  --  93* 95* 100* 102  CO2 24  --   --  23 24 16*  GLUCOSE 88  --  85 88 84 67  BUN 22*  --  28* 22* 15 16  CREATININE 1.27*  --  1.30* 1.09 0.97 0.97  CALCIUM >15.0*  --   --  13.8* 12.6* 10.3  MG  --  1.6*  --   --   --  1.2*  PHOS  --  2.3*  --   --   --   --    Liver Function Tests:  Recent Labs Lab 02/24/17 1338 02/25/17 0318 02/27/17 0912  AST 133* 104* 99*  ALT 86* 66* 68*  ALKPHOS 166* 131* 128*  BILITOT 2.7* 2.7* 2.5*  PROT 7.8 6.6 6.4*  ALBUMIN 3.2* 2.5* 2.5*    Recent Labs Lab 02/24/17 1338  LIPASE 44    Recent Labs Lab 02/24/17 1605  AMMONIA 89*   CBC:  Recent Labs Lab 02/24/17 1338 02/24/17 1629 02/25/17 0318 02/27/17 0912  WBC 10.9*  --  8.1 9.7  NEUTROABS 9.4*  --   --   --   HGB 14.2 16.0 12.3* 12.3*  HCT 42.8 47.0 37.3* 36.7*  MCV 97.5  --  98.4 97.6  PLT 246  --  212 163     IMAGING STUDIES Ct Head W & Wo Contrast  Result Date: 02/25/2017 CLINICAL DATA:  56 y/o M; hepatocellular carcinoma. Concern for intracranial metastasis. EXAM: CT HEAD WITHOUT AND WITH CONTRAST TECHNIQUE: Contiguous axial images were obtained from the base of the skull through the vertex without and with intravenous contrast CONTRAST:  12mL ISOVUE-300 IOPAMIDOL (ISOVUE-300) INJECTION 61% COMPARISON:  12/10/2016 CT head FINDINGS: Brain: No evidence of acute infarction, hemorrhage, hydrocephalus, extra-axial collection or  mass lesion/mass effect. After administration of intravenous contrast there is no abnormal enhancement of the brain. Vascular: Mild calcific atherosclerosis of the carotid siphons. Skull: Normal. Negative for fracture or focal lesion. Sinuses/Orbits: No acute finding. Other: None. IMPRESSION: No intracranial metastatic disease identified. Unremarkable CT of the head with and without contrast. Electronically Signed   By: Kristine Garbe M.D.   On: 02/25/2017 23:07   Ct Pelvis W Contrast  Result Date: 02/26/2017 CLINICAL DATA:  Cirrhosis with HCC versus hepatic metastases seen on prior CT. Pelvic mass seen on prior MRI concerning for metastatic disease. EXAM: CT PELVIS WITH CONTRAST TECHNIQUE: Multidetector CT imaging of the pelvis was performed using the standard protocol following the bolus administration of intravenous contrast. CONTRAST:  128mL ISOVUE-300 IOPAMIDOL (ISOVUE-300) INJECTION 61% COMPARISON:  CT 01/10/2017 and 01/24/2017.  MRI 02/25/2017. FINDINGS: Urinary Tract: Ureters are decompressed. Urinary bladder unremarkable. Bowel: Sigmoid diverticulosis. Pelvic small bowel decompressed and unremarkable. Vascular/Lymphatic: Aortic and iliac calcifications. No aneurysm. No adenopathy in the lower retroperitoneum or pelvis. Reproductive:  No visible focal abnormality. Other: No mass seen in the central pelvis as seen on prior MRI. Moderate to large volume free fluid in the pelvis. Musculoskeletal: Enlarging  soft tissue  sec enlarging areas of bony destruction and soft tissue masses involving the posterior right acetabulum measures up to 4.7 cm compared to 3.1 cm on prior CT. Enlarging destructive mass with soft tissue component in the left iliac bone measuring up to 5.3 cm compared to 2.8 cm previously. IMPRESSION: No central pelvic mass noted on today's CT as suggested on prior MRI. There is large volume free fluid in the pelvis. Enlarging soft tissue masses and bony destruction in the left iliac  bone and right posterior acetabulum. This is compatible with worsening metastatic disease. Sigmoid diverticulosis. Aortoiliac atherosclerosis. Electronically Signed   By: Rolm Baptise M.D.   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 EXAM: MRI ABDOMEN WITHOUT CONTRAST  (INCLUDING MRCP) TECHNIQUE: Multiplanar multisequence MR imaging of the abdomen was performed. Heavily T2-weighted images of the biliary and pancreatic ducts were obtained, and three-dimensional MRCP images were rendered by post processing. Patient refused to continue exam due to pain, and therefore no intravenous contrast was administered. COMPARISON:  CT on 01/24/2017 and 01/10/2017 FINDINGS: Lower chest: Tiny right pleural effusion. Hepatobiliary: Image degradation by respiratory motion artifact noted. Hepatomegaly again demonstrated. Numerous T2 hyperintense soft tissue masses are seen involving the liver diffusely. Comparison with previous CT is limited by differences in modalities. Allowing for this, no significant interval change is demonstrated. Gallbladder is unremarkable. No evidence of biliary ductal dilatation. Pancreas: No mass or inflammatory process visualized on this unenhanced exam. Spleen:  Within normal limits in size. Adrenals/Urinary tract: Unremarkable. No evidence of urolithiasis or hydronephrosis. Stomach/Bowel: No evidence of obstruction, inflammatory process, or abnormal fluid collections. Vascular/Lymphatic: Stable lymphadenopathy in porta hepatis with index lymph node measuring 2.8 cm on image 18/8. Retroperitoneal lymphadenopathy again seen in the pericaval space, with index lymph node measuring 1.9 cm on image 17/8. This also shows no significant change compared to previous study. No evidence of abdominal aortic aneurysm. Other: Mild perihepatic ascites is increased. Increased diffuse body wall edema. Pelvic ascites is also  seen. An irregular soft tissue mass is seen involving bowel loops in the central pelvis which mentions approximately cm common is suspicious for metastatic disease. Musculoskeletal: Lytic bone metastasis and associated soft tissue mass involving the left ilium has increased in size since previous study, currently measuring 6 cm compared to 3 cm previously. This is consistent with a bone metastasis. IMPRESSION: Image degradation by motion artifact noted. Hepatomegaly and innumerable diffuse hepatic masses show no significant change since prior CT allowing for differences in modalities. Porta hepatis and retroperitoneal lymphadenopathy, without significant change. New 8 cm irregular soft tissue mass in the central pelvis involving bowel loops, highly suspicious for metastatic disease. Pelvis CT is suggested further evaluation. Increased size of lytic bone metastasis involving the left ilium. Interval increase in mild ascites, diffuse body wall edema, and tiny right pleural effusion. Electronically Signed   By: Earle Gell M.D.   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 EXAM: MRI ABDOMEN WITHOUT CONTRAST  (INCLUDING MRCP) TECHNIQUE: Multiplanar multisequence MR imaging of the abdomen was performed. Heavily T2-weighted images of the biliary and pancreatic ducts were obtained, and three-dimensional MRCP images were rendered by post processing. Patient refused to continue exam due to pain, and therefore no intravenous contrast was administered. COMPARISON:  CT on 01/24/2017 and 01/10/2017 FINDINGS: Lower chest: Tiny right pleural  effusion. Hepatobiliary: Image degradation by respiratory motion artifact noted. Hepatomegaly again demonstrated. Numerous T2 hyperintense soft tissue masses are seen involving the liver diffusely. Comparison with previous CT is limited by differences in modalities. Allowing for this, no significant  interval change is demonstrated. Gallbladder is unremarkable. No evidence of biliary ductal dilatation. Pancreas: No mass or inflammatory process visualized on this unenhanced exam. Spleen:  Within normal limits in size. Adrenals/Urinary tract: Unremarkable. No evidence of urolithiasis or hydronephrosis. Stomach/Bowel: No evidence of obstruction, inflammatory process, or abnormal fluid collections. Vascular/Lymphatic: Stable lymphadenopathy in porta hepatis with index lymph node measuring 2.8 cm on image 18/8. Retroperitoneal lymphadenopathy again seen in the pericaval space, with index lymph node measuring 1.9 cm on image 17/8. This also shows no significant change compared to previous study. No evidence of abdominal aortic aneurysm. Other: Mild perihepatic ascites is increased. Increased diffuse body wall edema. Pelvic ascites is also seen. An irregular soft tissue mass is seen involving bowel loops in the central pelvis which mentions approximately cm common is suspicious for metastatic disease. Musculoskeletal: Lytic bone metastasis and associated soft tissue mass involving the left ilium has increased in size since previous study, currently measuring 6 cm compared to 3 cm previously. This is consistent with a bone metastasis. IMPRESSION: Image degradation by motion artifact noted. Hepatomegaly and innumerable diffuse hepatic masses show no significant change since prior CT allowing for differences in modalities. Porta hepatis and retroperitoneal lymphadenopathy, without significant change. New 8 cm irregular soft tissue mass in the central pelvis involving bowel loops, highly suspicious for metastatic disease. Pelvis CT is suggested further evaluation. Increased size of lytic bone metastasis involving the left ilium. Interval increase in mild ascites, diffuse body wall edema, and tiny right pleural effusion. Electronically Signed   By: Earle Gell M.D.   On: 02/26/2017 09:33    DISCHARGE EXAMINATION: See  progress note from earlier today  DISPOSITION: Residential hospice    ALLERGIES:  Allergies  Allergen Reactions  . Codeine Other (See Comments)    "screws me up"  . Penicillins Nausea And Vomiting    Has patient had a PCN reaction causing immediate rash, facial/tongue/throat swelling, SOB or lightheadedness with hypotension: No Has patient had a PCN reaction causing severe rash involving mucus membranes or skin necrosis: No Has patient had a PCN reaction that required hospitalization: No Has patient had a PCN reaction occurring within the last 10 years: Yes If all of the above answers are "NO", then may proceed with Cephalosporin use.      Current Discharge Medication List    START taking these medications   Details  acetaminophen (TYLENOL) 325 MG tablet Take 2 tablets (650 mg total) by mouth every 6 (six) hours as needed for fever or headache.    feeding supplement, ENSURE ENLIVE, (ENSURE ENLIVE) LIQD Take 237 mLs by mouth 3 (three) times daily between meals. Qty: 237 mL, Refills: 12    haloperidol lactate (HALDOL) 5 MG/ML injection Inject 0.4 mLs (2 mg total) into the vein every 6 (six) hours as needed (Hold if QTc>416ms). Qty: 1 mL    ibuprofen (ADVIL,MOTRIN) 400 MG tablet Take 1 tablet (400 mg total) by mouth every 4 (four) hours as needed for mild pain. Qty: 30 tablet, Refills: 0    LORazepam (ATIVAN) 2 MG/ML injection Inject 0.5 mLs (1 mg total) into the vein every 6 (six) hours as needed for anxiety. Qty: 1 mL, Refills: 0    morphine 2 MG/ML injection Inject 1 mL (2 mg  total) into the vein every 3 (three) hours as needed (severe pain). Qty: 1 mL, Refills: 0    Morphine Sulfate (MORPHINE CONCENTRATE) 10 MG/0.5ML SOLN concentrated solution Take 0.25 mLs (5 mg total) by mouth every 2 (two) hours as needed for moderate pain or shortness of breath. Qty: 180 mL    nicotine (NICODERM CQ - DOSED IN MG/24 HOURS) 21 mg/24hr patch Place 1 patch (21 mg total) onto the skin  daily. Qty: 28 patch, Refills: 0    QUEtiapine (SEROQUEL) 25 MG tablet Take 1 tablet (25 mg total) by mouth at bedtime.        TOTAL DISCHARGE TIME: 35 minutes  [facility] Pager [phone]  02/28/2017, 3:46 PM

## 2017-02-28 NOTE — Progress Notes (Signed)
Patient ID: Dennis Bell, male   DOB: 07/20/1961, 56 y.o.   MRN: [id number]  This NP visited patient at the bedside as a follow up to  yesterday's [place].  Per oncology given the CT findings and the very elevated AFP, the  working diagnosis is hepatocellular CA in the setting of hepatitis C. He is not a candidate for surgery or ablative procedures as the tumor is already outside the liver. Systemic therapies are not recommended.   The patient himself verbalizes he really has no desire for life prolonging measures, his main wish is to discharge from the hospital.    Today Dennis Bell appears more declined, he is taking very little by mouth.  He is dyspneic and wheezing. Will minimize medications and focus only on mediations to enhance comfort. Discussed with and educated nursing on utilization of medications for EOL care.  His prognosis is less than a few weeks, will write for hospice facilty referral. Family is requesting Dennis Bell.     Discussed with Dr Maryland Pink  Time in    0730       Time out    0805  Greater than 50% of the time was spent in counseling and coordination of care  Wadie Lessen NP  Palliative Medicine Team Team Phone # [phone] Pager [phone]

## 2017-03-06 ENCOUNTER — Other Ambulatory Visit: Payer: Self-pay | Admitting: Student

## 2017-03-07 ENCOUNTER — Ambulatory Visit (HOSPITAL_COMMUNITY): Admission: RE | Admit: 2017-03-07 | Payer: Medicare Other | Source: Ambulatory Visit

## 2017-03-29 DEATH — deceased
